# Patient Record
Sex: Female | Born: 1980 | Race: White | Hispanic: No | Marital: Married | Smoking: Never smoker
Health system: Southern US, Community
[De-identification: ages and names within clinical notes are randomized; demographics above are authoritative.]

## PROBLEM LIST (undated history)

## (undated) DIAGNOSIS — R87619 Unspecified abnormal cytological findings in specimens from cervix uteri: Secondary | ICD-10-CM

## (undated) DIAGNOSIS — J45909 Unspecified asthma, uncomplicated: Secondary | ICD-10-CM

## (undated) DIAGNOSIS — R51 Headache: Secondary | ICD-10-CM

## (undated) DIAGNOSIS — L309 Dermatitis, unspecified: Secondary | ICD-10-CM

## (undated) DIAGNOSIS — IMO0002 Reserved for concepts with insufficient information to code with codable children: Secondary | ICD-10-CM

## (undated) DIAGNOSIS — O4290 Premature rupture of membranes, unspecified as to length of time between rupture and onset of labor, unspecified weeks of gestation: Secondary | ICD-10-CM

## (undated) HISTORY — DX: Unspecified asthma, uncomplicated: J45.909

## (undated) HISTORY — PX: ENDOMETRIAL BIOPSY: SHX622

## (undated) HISTORY — PX: WISDOM TOOTH EXTRACTION: SHX21

## (undated) HISTORY — DX: Dermatitis, unspecified: L30.9

## (undated) HISTORY — PX: COLPOSCOPY: SHX161

---

## 2001-07-29 ENCOUNTER — Encounter: Payer: Self-pay | Admitting: Internal Medicine

## 2001-07-29 ENCOUNTER — Ambulatory Visit (HOSPITAL_COMMUNITY): Admission: RE | Admit: 2001-07-29 | Discharge: 2001-07-29 | Payer: Self-pay | Admitting: Internal Medicine

## 2004-03-05 ENCOUNTER — Ambulatory Visit (HOSPITAL_COMMUNITY): Admission: RE | Admit: 2004-03-05 | Discharge: 2004-03-05 | Payer: Self-pay | Admitting: Family Medicine

## 2011-06-06 ENCOUNTER — Ambulatory Visit (INDEPENDENT_AMBULATORY_CARE_PROVIDER_SITE_OTHER): Payer: 59 | Admitting: Family Medicine

## 2011-06-06 DIAGNOSIS — K645 Perianal venous thrombosis: Secondary | ICD-10-CM

## 2011-06-06 MED ORDER — HYDROCORTISONE 2.5 % RE CREA: TOPICAL_CREAM | Freq: Two times a day (BID) | RECTAL | Status: AC

## 2011-06-06 MED ORDER — HYDROCORTISONE ACETATE 25 MG RE SUPP: 25.0000 mg | Freq: Two times a day (BID) | RECTAL | Status: AC

## 2011-06-06 NOTE — Progress Notes (Signed)
  [facility]:  Office Visit  Chief Complaint:  Chief Complaint  Patient presents with  . Hemorrhoids    over a week    HPI: Roberta Wood is a 31 y.o. female who complains of hemorrhoids x 10 days. Tried Ibuprofena nd Prep H without relief. No prior h/o hemorrhoids. Denies constipation. No children. Does not have to strain during defecation. Denies bloody stools.   History reviewed. No pertinent past medical history. History reviewed. No pertinent past surgical history. History   Social History  . Marital Status: Married    Spouse Name: N/A    Number of Children: N/A  . Years of Education: N/A   Social History Main Topics  . Smoking status: Never Smoker   . Smokeless tobacco: None  . Alcohol Use: Yes  . Drug Use: No  . Sexually Active: None   Other Topics Concern  . None   Social History Narrative  . None   Family History  Problem Relation Age of Onset  . Heart disease Father    Allergies  Allergen Reactions  . Penicillins    Prior to Admission medications   Not on File     ROS: The patient denies fevers, chills, night sweats, unintentional weight loss, chest pain, palpitations, wheezing, dyspnea on exertion, nausea, vomiting, abdominal pain, dysuria, hematuria, melena, numbness, weakness, or tingling.   All other systems have been reviewed and were otherwise negative with the exception of those mentioned in the HPI and as above.    PHYSICAL EXAM: Filed Vitals:   06/06/11 1214  BP: 119/71  Pulse: 87  Temp: 99.3 F (37.4 C)  Resp: 18   Filed Vitals:   06/06/11 1214  Height: 5\' 5"  (1.651 m)  Weight: 124 lb (56.246 kg)   Body mass index is 20.63 kg/(m^2).  General: Alert, no acute distress HEENT:  Normocephalic, atraumatic, oropharynx patent.  Cardiovascular:  Regular rate and rhythm, no rubs murmurs or gallops.  No Carotid bruits, radial pulse intact. No pedal edema.  Respiratory: Clear to auscultation bilaterally.  No  wheezes, rales, or rhonchi.  No cyanosis, no use of accessory musculature GI: No organomegaly, abdomen is soft and non-tender, positive bowel sounds.  No masses. Skin: No rashes. Neurologic: Facial musculature symmetric. Psychiatric: Patient is appropriate throughout our interaction. Lymphatic: No cervical lymphadenopathy Musculoskeletal: Gait intact. + external hemmorhoids, not thrombotic.    LABS: No results found for this or any previous visit.   EKG/XRAY:   Primary read interpreted by Dr. Conley Rolls at [facility].   ASSESSMENT/PLAN: Encounter Diagnosis  Name Primary?  . Hemorrhoids Yes    Sxs treatment with Anusol suppository and HCL cream prn.  Instructions on management of external hemorrhoids given.  Currently not embolic/thrombotic so no evacuation needed. F/u prn if worsening sxs   Linas Stepter PHUONG, DO 06/09/2011 2:12 PM

## 2012-07-20 ENCOUNTER — Other Ambulatory Visit: Payer: Self-pay

## 2012-09-24 ENCOUNTER — Other Ambulatory Visit (HOSPITAL_COMMUNITY): Payer: Self-pay | Admitting: Obstetrics and Gynecology

## 2012-09-24 DIAGNOSIS — Q270 Congenital absence and hypoplasia of umbilical artery: Secondary | ICD-10-CM

## 2012-09-28 ENCOUNTER — Ambulatory Visit (HOSPITAL_COMMUNITY)
Admission: RE | Admit: 2012-09-28 | Discharge: 2012-09-28 | Disposition: A | Discharge status: Home / Self Care | Payer: 59 | Source: Ambulatory Visit | Attending: Obstetrics and Gynecology | Admitting: Obstetrics and Gynecology

## 2012-09-28 ENCOUNTER — Encounter (HOSPITAL_COMMUNITY): Payer: Self-pay

## 2012-09-28 DIAGNOSIS — Z363 Encounter for antenatal screening for malformations: Secondary | ICD-10-CM | POA: Insufficient documentation

## 2012-09-28 DIAGNOSIS — O352XX0 Maternal care for (suspected) hereditary disease in fetus, not applicable or unspecified: Secondary | ICD-10-CM | POA: Insufficient documentation

## 2012-09-28 DIAGNOSIS — Q270 Congenital absence and hypoplasia of umbilical artery: Secondary | ICD-10-CM

## 2012-09-28 DIAGNOSIS — O358XX0 Maternal care for other (suspected) fetal abnormality and damage, not applicable or unspecified: Secondary | ICD-10-CM | POA: Insufficient documentation

## 2012-09-28 DIAGNOSIS — Z1389 Encounter for screening for other disorder: Secondary | ICD-10-CM | POA: Insufficient documentation

## 2012-11-13 ENCOUNTER — Encounter (HOSPITAL_COMMUNITY): Payer: Self-pay | Admitting: *Deleted

## 2012-11-13 ENCOUNTER — Inpatient Hospital Stay (HOSPITAL_COMMUNITY)
Admission: AD | Admit: 2012-11-13 | Discharge: 2012-12-29 | DRG: 765 | Disposition: A | Discharge status: Home / Self Care | Payer: 59 | Source: Ambulatory Visit | Attending: Obstetrics and Gynecology | Admitting: Obstetrics and Gynecology

## 2012-11-13 ENCOUNTER — Inpatient Hospital Stay (HOSPITAL_COMMUNITY): Payer: 59

## 2012-11-13 DIAGNOSIS — O34219 Maternal care for unspecified type scar from previous cesarean delivery: Secondary | ICD-10-CM | POA: Diagnosis present

## 2012-11-13 DIAGNOSIS — O99892 Other specified diseases and conditions complicating childbirth: Secondary | ICD-10-CM | POA: Diagnosis present

## 2012-11-13 DIAGNOSIS — O411090 Infection of amniotic sac and membranes, unspecified, unspecified trimester, not applicable or unspecified: Secondary | ICD-10-CM | POA: Diagnosis present

## 2012-11-13 DIAGNOSIS — Z98891 History of uterine scar from previous surgery: Secondary | ICD-10-CM

## 2012-11-13 DIAGNOSIS — O4290 Premature rupture of membranes, unspecified as to length of time between rupture and onset of labor, unspecified weeks of gestation: Principal | ICD-10-CM | POA: Diagnosis present

## 2012-11-13 DIAGNOSIS — O321XX0 Maternal care for breech presentation, not applicable or unspecified: Secondary | ICD-10-CM | POA: Diagnosis present

## 2012-11-13 DIAGNOSIS — O322XX0 Maternal care for transverse and oblique lie, not applicable or unspecified: Secondary | ICD-10-CM | POA: Diagnosis present

## 2012-11-13 DIAGNOSIS — Q270 Congenital absence and hypoplasia of umbilical artery: Secondary | ICD-10-CM

## 2012-11-13 HISTORY — DX: Premature rupture of membranes, unspecified as to length of time between rupture and onset of labor, unspecified weeks of gestation: O42.90

## 2012-11-13 HISTORY — DX: Headache: R51

## 2012-11-13 HISTORY — DX: Unspecified abnormal cytological findings in specimens from cervix uteri: R87.619

## 2012-11-13 HISTORY — DX: Reserved for concepts with insufficient information to code with codable children: IMO0002

## 2012-11-13 LAB — CBC
Platelets: 222 10*3/uL (ref 150–400)
RBC: 3.99 MIL/uL (ref 3.87–5.11)
RDW: 12.9 % (ref 11.5–15.5)
WBC: 10.4 10*3/uL (ref 4.0–10.5)

## 2012-11-13 LAB — COMPREHENSIVE METABOLIC PANEL
ALT: 13 U/L (ref 0–35)
AST: 19 U/L (ref 0–37)
Albumin: 3.2 g/dL — ABNORMAL LOW (ref 3.5–5.2)
Alkaline Phosphatase: 78 U/L (ref 39–117)
CO2: 22 mEq/L (ref 19–32)
Chloride: 101 mEq/L (ref 96–112)
GFR calc non Af Amer: >90 mL/min (ref >90)
Potassium: 3.6 mEq/L (ref 3.5–5.1)
Sodium: 137 mEq/L (ref 135–145)
Total Bilirubin: 0.3 mg/dL (ref 0.3–1.2)

## 2012-11-13 LAB — POCT FERN TEST: POCT Fern Test: POSITIVE

## 2012-11-13 MED ORDER — MAGNESIUM SULFATE BOLUS VIA INFUSION
4.0000 g | Freq: Once | INTRAVENOUS | Status: AC
  Administered 2012-11-13: 4 g via INTRAVENOUS
  Filled 2012-11-13: qty 500

## 2012-11-13 MED ORDER — LACTATED RINGERS IV SOLN
INTRAVENOUS | Status: DC
  Administered 2012-11-13 – 2012-11-16 (×6): via INTRAVENOUS
  Administered 2012-11-29: 100 mL via INTRAVENOUS

## 2012-11-13 MED ORDER — BETAMETHASONE SOD PHOS & ACET 6 (3-3) MG/ML IJ SUSP
12.0000 mg | Freq: Once | INTRAMUSCULAR | Status: AC
  Administered 2012-11-13: 12 mg via INTRAMUSCULAR
  Filled 2012-11-13: qty 2

## 2012-11-13 MED ORDER — ACETAMINOPHEN 325 MG PO TABS
650.0000 mg | ORAL_TABLET | ORAL | Status: DC | PRN
  Administered 2012-11-14 – 2012-12-24 (×16): 650 mg via ORAL
  Filled 2012-11-13 (×16): qty 2

## 2012-11-13 MED ORDER — CALCIUM CARBONATE ANTACID 500 MG PO CHEW
2.0000 | CHEWABLE_TABLET | ORAL | Status: DC | PRN
  Administered 2012-12-02 – 2012-12-19 (×3): 400 mg via ORAL
  Filled 2012-11-13 (×3): qty 2

## 2012-11-13 MED ORDER — ACYCLOVIR 5 % EX OINT
TOPICAL_OINTMENT | CUTANEOUS | Status: DC
  Administered 2012-11-13: 22:00:00 via TOPICAL
  Filled 2012-11-13: qty 30

## 2012-11-13 MED ORDER — PRENATAL MULTIVITAMIN CH
1.0000 | ORAL_TABLET | Freq: Every day | ORAL | Status: DC
  Administered 2012-11-14 – 2012-12-25 (×41): 1 via ORAL
  Filled 2012-11-13 (×41): qty 1

## 2012-11-13 MED ORDER — DEXTROSE 5 % IV SOLN
500.0000 mg | INTRAVENOUS | Status: AC
  Administered 2012-11-13 – 2012-11-14 (×2): 500 mg via INTRAVENOUS
  Filled 2012-11-13 (×2): qty 500

## 2012-11-13 MED ORDER — AZITHROMYCIN 500 MG PO TABS
500.0000 mg | ORAL_TABLET | Freq: Every day | ORAL | Status: AC
  Administered 2012-11-15 – 2012-11-19 (×5): 500 mg via ORAL
  Filled 2012-11-13 (×5): qty 1

## 2012-11-13 MED ORDER — CLINDAMYCIN PHOSPHATE 900 MG/50ML IV SOLN
900.0000 mg | Freq: Three times a day (TID) | INTRAVENOUS | Status: DC
  Administered 2012-11-13 – 2012-11-16 (×9): 900 mg via INTRAVENOUS
  Filled 2012-11-13 (×10): qty 50

## 2012-11-13 MED ORDER — ZOLPIDEM TARTRATE 5 MG PO TABS
5.0000 mg | ORAL_TABLET | Freq: Every evening | ORAL | Status: DC | PRN
  Administered 2012-11-14 – 2012-12-18 (×17): 5 mg via ORAL
  Filled 2012-11-13 (×19): qty 1

## 2012-11-13 MED ORDER — MAGNESIUM SULFATE 40 G IN LACTATED RINGERS - SIMPLE
2.0000 g/h | INTRAVENOUS | Status: DC
  Administered 2012-11-13 – 2012-11-14 (×2): 2 g/h via INTRAVENOUS
  Filled 2012-11-13 (×2): qty 500

## 2012-11-13 MED ORDER — DOCUSATE SODIUM 100 MG PO CAPS
100.0000 mg | ORAL_CAPSULE | Freq: Every day | ORAL | Status: DC
  Administered 2012-11-14 – 2012-12-25 (×39): 100 mg via ORAL
  Filled 2012-11-13 (×40): qty 1

## 2012-11-13 MED ORDER — PENCICLOVIR 1 % EX CREA
1.0000 "application " | TOPICAL_CREAM | CUTANEOUS | Status: DC
  Filled 2012-11-13: qty 1.5

## 2012-11-13 NOTE — H&P (Signed)
Roberta Wood is a 32 y.o. female presenting for C/O leaking AF. Patient states spontaneous leaking at home that has continued. No fever, no N/V, no vaginal bleeding, no UCs. Pregnancy complicated by single umbilical artery with an otherwise normal anatomic survey with MFM. Maternal Medical History:  Reason for admission: Rupture of membranes.   Fetal activity: Perceived fetal activity is normal.      OB History   Grav Para Term Preterm Abortions TAB SAB Ect Mult Living   1         0     Past Medical History  Diagnosis Date  . Abnormal Pap smear   . WUJWJXBJ(478.2)    Past Surgical History  Procedure Laterality Date  . Colposcopy    . Wisdom tooth extraction    . Endometrial biopsy     Family History: family history includes Heart disease in her father. Social History:  reports that she has never smoked. She does not have any smokeless tobacco history on file. She reports that  drinks alcohol. She reports that she does not use illicit drugs.   Prenatal Transfer Tool  Maternal Diabetes: No Genetic Screening: Declined Maternal Ultrasounds/Referrals: Abnormal:  Findings:   Other:single umbilical artery Fetal Ultrasounds or other Referrals:  None Maternal Substance Abuse:  No Significant Maternal Medications:  None Significant Maternal Lab Results:  None Other Comments:  None  Review of Systems  Constitutional: Negative for fever.  Eyes: Negative for blurred vision.  Gastrointestinal: Negative for vomiting.  Neurological: Negative for headaches.    Dilation:  (exam deferred) Blood pressure 106/70, pulse 92, temperature 97.8 F (36.6 C), temperature source Oral, resp. rate 18, height 5\' 5"  (1.651 m), weight 128 lb 6 oz (58.231 kg), last menstrual period 05/11/2012. Maternal Exam:  Uterine Assessment: Contraction strength is mild.  Contraction frequency is irregular.   Abdomen: Fetal presentation: breech     Physical Exam  Cardiovascular: Normal rate and  regular rhythm.   Respiratory: Effort normal and breath sounds normal.  GI: Soft. There is no tenderness.  Neurological: She has normal reflexes.    Gross rupture of clear fluid per physician extender exam in MAU  U/S-Frank Breech, AFI 14.6, EFW 1012 gm (2# 4oz), placenta anterior above cervix, cx 3.3cm-no funneling  Results for orders placed during the hospital encounter of 11/13/12 (from the past 24 hour(s))  POCT FERN TEST     Status: None   Collection Time    11/13/12  4:36 PM      Result Value Range   POCT Fern Test Positive = ruptured amniotic membanes    CBC     Status: None   Collection Time    11/13/12  5:01 PM      Result Value Range   WBC 10.4  4.0 - 10.5 K/uL   RBC 3.99  3.87 - 5.11 MIL/uL   Hemoglobin 12.6  12.0 - 15.0 g/dL   HCT 95.6  21.3 - 08.6 %   MCV 92.2  78.0 - 100.0 fL   MCH 31.6  26.0 - 34.0 pg   MCHC 34.2  30.0 - 36.0 g/dL   RDW 57.8  46.9 - 62.9 %   Platelets 222  150 - 400 K/uL  COMPREHENSIVE METABOLIC PANEL     Status: Abnormal   Collection Time    11/13/12  5:01 PM      Result Value Range   Sodium 137  135 - 145 mEq/L   Potassium 3.6  3.5 - 5.1 mEq/L  Chloride 101  96 - 112 mEq/L   CO2 22  19 - 32 mEq/L   Glucose, Bld 95  70 - 99 mg/dL   BUN 8  6 - 23 mg/dL   Creatinine, Ser 6.23  0.50 - 1.10 mg/dL   Calcium 9.5  8.4 - 76.2 mg/dL   Total Protein 6.6  6.0 - 8.3 g/dL   Albumin 3.2 (*) 3.5 - 5.2 g/dL   AST 19  0 - 37 U/L   ALT 13  0 - 35 U/L   Alkaline Phosphatase 78  39 - 117 U/L   Total Bilirubin 0.3  0.3 - 1.2 mg/dL   GFR calc non Af Amer >90  >90 mL/min   GFR calc Af Amer >90  >90 mL/min    Prenatal labs: ABO, Rh:   Antibody:   Rubella:   RPR:    HBsAg:    HIV:    GBS:     Assessment/Plan: 32 yo G1P0 at 5 4/7 weeks by early U/S with PPROM, breech D/W patient and husband above, possibility of premature delivery for onset of labor, infection, non-reassuring fetal status, etc.. D/W C/S for delivery with breech baby, possible  vertical uterine incision and future repeat C/S Magnesium Sulfate for neuroprophylaxis, antibiotics for GBBS prophylaxis, betamethasone Neonatology consult ordered   Roselyne Stalnaker II,Takeshi Teasdale E 11/13/2012, 6:19 PM

## 2012-11-13 NOTE — MAU Note (Signed)
Pt states began leaking this am, thought it was urine. Noted clear lof coming from vagina not urethra when she went to restroom last at home.

## 2012-11-13 NOTE — MAU Provider Note (Signed)
  History     CSN: [id number]  Arrival date and time: 11/13/12 1533   First Provider Initiated Contact with Patient 11/13/12 1611      Chief Complaint  Patient presents with  . R/O Rupture, contractions    HPI Roberta Wood 32 y.o. [redacted]w[redacted]d   Comes to MAU with history of leaking of clear fluid several times today.  Has had some irregular contractions but is not uncomfortable.  OB History   Grav Para Term Preterm Abortions TAB SAB Ect Mult Living   1         0      Past Medical History  Diagnosis Date  . Abnormal Pap smear   . WUJWJXBJ(478.2)     Past Surgical History  Procedure Laterality Date  . Colposcopy    . Wisdom tooth extraction    . Endometrial biopsy      Family History  Problem Relation Age of Onset  . Heart disease Father     History  Substance Use Topics  . Smoking status: Never Smoker   . Smokeless tobacco: Not on file  . Alcohol Use: Yes    Allergies:  Allergies  Allergen Reactions  . Penicillins Hives    Prescriptions prior to admission  Medication Sig Dispense Refill  . calcium carbonate (TUMS - DOSED IN MG ELEMENTAL CALCIUM) 500 MG chewable tablet Chew 1 tablet by mouth daily as needed for heartburn.      . Loratadine (CLARITIN PO) Take 1 each by mouth daily as needed (allergies).      . penciclovir (DENAVIR) 1 % cream Apply 1 application topically every 2 (two) hours as needed (cold soar).      . Prenatal Vit-Fe Fumarate-FA (PRENATAL MULTIVITAMIN) TABS Take 1 tablet by mouth daily at 12 noon.        Review of Systems  Constitutional: Negative for fever.  Gastrointestinal: Negative for nausea, vomiting and abdominal pain.  Genitourinary:       Leaking fluid. No vaginal bleeding   Physical Exam   Blood pressure 106/70, pulse 92, temperature 97.8 F (36.6 C), temperature source Oral, resp. rate 18, height 5\' 5"  (1.651 m), weight 128 lb 6 oz (58.231 kg), last menstrual period 05/11/2012.  Physical Exam  Nursing note and vitals  reviewed. Constitutional: She is oriented to person, place, and time. She appears well-developed and well-nourished.  HENT:  Head: Normocephalic.  Eyes: EOM are normal.  Neck: Neck supple.  GI: Soft. There is no tenderness. There is no rebound and no guarding.  Genitourinary:  Speculum exam As speculum was inserted and started to open, clear amniotic fluid filled the speculum and ran out into the floor.  Speculum withdrawn.  Cervix not visualized.  No bimanual exam done.  Fern slide made from sterile swab.  Musculoskeletal: Normal range of motion.  Neurological: She is alert and oriented to person, place, and time.  Skin: Skin is warm and dry.  Psychiatric: She has a normal mood and affect.    MAU Course  Procedures  MDM 1645  Dr. Henderson Cloud notified.  Gross ROM Client was teary after speculum exam and contractions every 2-4 minutes noted while IVF started.  Assessment and Plan  PPROM  Plan Admit See orders.  Roberta Wood 11/13/2012, 4:46 PM

## 2012-11-13 NOTE — MAU Note (Signed)
Pam, RN charge Antenatal given pt report. Pt to go to room 152.

## 2012-11-14 LAB — CBC
MCV: 92.2 fL (ref 78.0–100.0)
Platelets: 211 10*3/uL (ref 150–400)
RBC: 3.61 MIL/uL — ABNORMAL LOW (ref 3.87–5.11)
WBC: 13.1 10*3/uL — ABNORMAL HIGH (ref 4.0–10.5)

## 2012-11-14 LAB — MAGNESIUM: Magnesium: 4.5 mg/dL — ABNORMAL HIGH (ref 1.5–2.5)

## 2012-11-14 MED ORDER — ACYCLOVIR 5 % EX OINT
TOPICAL_OINTMENT | CUTANEOUS | Status: DC
  Administered 2012-11-14 – 2012-11-18 (×27): via TOPICAL
  Administered 2012-11-18: 1 via TOPICAL
  Administered 2012-11-18 – 2012-11-19 (×6): via TOPICAL
  Filled 2012-11-14 (×2): qty 5
  Filled 2012-11-14: qty 30

## 2012-11-14 MED ORDER — ONDANSETRON HCL 4 MG/2ML IJ SOLN
4.0000 mg | Freq: Four times a day (QID) | INTRAMUSCULAR | Status: DC | PRN
  Administered 2012-11-14 – 2012-12-25 (×4): 4 mg via INTRAVENOUS
  Filled 2012-11-14 (×4): qty 2

## 2012-11-14 MED ORDER — BETAMETHASONE SOD PHOS & ACET 6 (3-3) MG/ML IJ SUSP
12.0000 mg | Freq: Once | INTRAMUSCULAR | Status: AC
  Administered 2012-11-14: 12 mg via INTRAMUSCULAR
  Filled 2012-11-14: qty 2

## 2012-11-14 NOTE — Progress Notes (Signed)
Notified Dr. Henderson Cloud of prolonged decels noted on strip graph.  Instructed to continue mag. Sulfate, bedrest, and BSC.

## 2012-11-14 NOTE — Progress Notes (Signed)
Pt states she has been unable to sleep and has just been "dozing off" and requesting something for sleep at this time; ambien 5mg  given per MD order.

## 2012-11-14 NOTE — Consult Note (Signed)
Neonatology Consult Note: At the request of the patients obstetrician Dr. Henderson Cloud I met with Ms. Firmin (and husband) who is at 26 4 wks currently with pregnancy complicated by PPROM, breech presentation, single umbilical artery.  Getting BMZ and magnesium sulfate.    We discussed morbidity/mortality at this gestional age, delivery room resuscitation, including intubation and surfactant in DR.  Discussed mechanical ventilation and risk for chronic lung disease, risk for IVH with potential for motor / cognitive deficits, ROP, NEC, sepsis, as well as temperature instability and feeding immaturity.  Discussed NG / OG feeds, benefits of MBM in reducing incidence of NEC.   Discussed likely length of stay. Thank you for allowing [place] to participate in her care.  Please call with questions.  John Giovanni, DO  Neonatologist  The total length of face-to-face or floor / unit time for this encounter was 25 minutes.  Counseling and / or coordination of care was greater than fifty percent of the time.         The total length of face-to-face or floor / unit time for this encounter was 30 minutes.  Counseling and / or coordination of care was greater than fifty percent of the time.

## 2012-11-14 NOTE — Progress Notes (Signed)
26 5/7 wks  Continues to leak small amounts of clear fluid Not feeling contractions No bleeding  VSS Afeb Ut soft/NT Ext PAS on Lungs CTA DTR 2+  FHT reactive UCs mild irreg  BMTZ #2 today  Magnesium Sulfate running  Results for orders placed during the hospital encounter of 11/13/12 (from the past 24 hour(s))  POCT FERN TEST     Status: None   Collection Time    11/13/12  4:36 PM      Result Value Range   POCT Fern Test Positive = ruptured amniotic membanes    CBC     Status: None   Collection Time    11/13/12  5:01 PM      Result Value Range   WBC 10.4  4.0 - 10.5 K/uL   RBC 3.99  3.87 - 5.11 MIL/uL   Hemoglobin 12.6  12.0 - 15.0 g/dL   HCT 21.3  08.6 - 57.8 %   MCV 92.2  78.0 - 100.0 fL   MCH 31.6  26.0 - 34.0 pg   MCHC 34.2  30.0 - 36.0 g/dL   RDW 46.9  62.9 - 52.8 %   Platelets 222  150 - 400 K/uL  COMPREHENSIVE METABOLIC PANEL     Status: Abnormal   Collection Time    11/13/12  5:01 PM      Result Value Range   Sodium 137  135 - 145 mEq/L   Potassium 3.6  3.5 - 5.1 mEq/L   Chloride 101  96 - 112 mEq/L   CO2 22  19 - 32 mEq/L   Glucose, Bld 95  70 - 99 mg/dL   BUN 8  6 - 23 mg/dL   Creatinine, Ser 4.13  0.50 - 1.10 mg/dL   Calcium 9.5  8.4 - 24.4 mg/dL   Total Protein 6.6  6.0 - 8.3 g/dL   Albumin 3.2 (*) 3.5 - 5.2 g/dL   AST 19  0 - 37 U/L   ALT 13  0 - 35 U/L   Alkaline Phosphatase 78  39 - 117 U/L   Total Bilirubin 0.3  0.3 - 1.2 mg/dL   GFR calc non Af Amer >90  >90 mL/min   GFR calc Af Amer >90  >90 mL/min  CBC     Status: Abnormal   Collection Time    11/14/12  7:00 AM      Result Value Range   WBC 13.1 (*) 4.0 - 10.5 K/uL   RBC 3.61 (*) 3.87 - 5.11 MIL/uL   Hemoglobin 11.5 (*) 12.0 - 15.0 g/dL   HCT 01.0 (*) 27.2 - 53.6 %   MCV 92.2  78.0 - 100.0 fL   MCH 31.9  26.0 - 34.0 pg   MCHC 34.5  30.0 - 36.0 g/dL   RDW 64.4  03.4 - 74.2 %   Platelets 211  150 - 400 K/uL    A: PPROM     BREECH  P: Continue magnesium sulfate for  neuroprophylaxis until am     BMTZ     CBC tomorrow     U/S tomorrow to check AFI and cx

## 2012-11-15 LAB — CBC
Hemoglobin: 10.3 g/dL — ABNORMAL LOW (ref 12.0–15.0)
MCHC: 34 g/dL (ref 30.0–36.0)
RBC: 3.25 MIL/uL — ABNORMAL LOW (ref 3.87–5.11)
WBC: 15.2 10*3/uL — ABNORMAL HIGH (ref 4.0–10.5)

## 2012-11-15 MED ORDER — OXYCODONE-ACETAMINOPHEN 5-325 MG PO TABS
1.0000 | ORAL_TABLET | Freq: Four times a day (QID) | ORAL | Status: DC | PRN
  Administered 2012-11-15 – 2012-12-24 (×3): 1 via ORAL
  Filled 2012-11-15 (×3): qty 1

## 2012-11-15 MED ORDER — POLYETHYLENE GLYCOL 3350 17 G PO PACK
17.0000 g | PACK | Freq: Every day | ORAL | Status: DC | PRN
  Administered 2012-11-15 – 2012-12-09 (×3): 17 g via ORAL
  Filled 2012-11-15 (×2): qty 1

## 2012-11-15 NOTE — Progress Notes (Signed)
Pt c/o HA today.  Leaking clear fluid.  Occ ctx.  No vb.  + FM  FHT + Toco occasional Cvx deferred Abd gravid, mildly tender RLQ  A/P:  PPROM D/c mag today S/p BMZ [place] today

## 2012-11-15 NOTE — Progress Notes (Signed)
Per MD will see if HA goes away after Magnesium is d/c'd if not prn ordered

## 2012-11-16 ENCOUNTER — Inpatient Hospital Stay (HOSPITAL_COMMUNITY): Payer: 59

## 2012-11-16 LAB — CULTURE, BETA STREP (GROUP B ONLY)

## 2012-11-16 LAB — CBC
HCT: 30.2 % — ABNORMAL LOW (ref 36.0–46.0)
Hemoglobin: 10.3 g/dL — ABNORMAL LOW (ref 12.0–15.0)
RDW: 13.4 % (ref 11.5–15.5)
WBC: 9.8 10*3/uL (ref 4.0–10.5)

## 2012-11-16 MED ORDER — PSEUDOEPHEDRINE HCL 30 MG PO TABS
60.0000 mg | ORAL_TABLET | Freq: Four times a day (QID) | ORAL | Status: DC | PRN
  Administered 2012-11-16 – 2012-12-17 (×2): 60 mg via ORAL
  Filled 2012-11-16 (×2): qty 2

## 2012-11-16 MED ORDER — OXYMETAZOLINE HCL 0.05 % NA SOLN
1.0000 | Freq: Two times a day (BID) | NASAL | Status: DC
  Administered 2012-11-16 – 2012-12-25 (×75): 1 via NASAL
  Filled 2012-11-16 (×2): qty 15

## 2012-11-16 MED ORDER — CLINDAMYCIN HCL 300 MG PO CAPS
300.0000 mg | ORAL_CAPSULE | Freq: Three times a day (TID) | ORAL | Status: AC
  Administered 2012-11-16 – 2012-11-21 (×15): 300 mg via ORAL
  Filled 2012-11-16 (×15): qty 1

## 2012-11-16 NOTE — Progress Notes (Signed)
11/16/12 1400  Clinical Encounter Type  Visited With Patient and family together (husband Arlys John)  Visit Type Initial;Spiritual support;Social support  Spiritual Encounters  Spiritual Needs Emotional   Introduced spiritual care and chaplain availability.  Learned some of couple's story, including how they met at [employer], my alma mater. Built rapport for future support.  They expect that their priest from [employer] will visit this afternoon or sometime tomorrow and report good support from local family and friends.  Provided pastoral listening and encouragement.  Will follow for support, but please also page as needed.  [address] [place], [place] [phone]

## 2012-11-16 NOTE — Progress Notes (Signed)
Patient ID: Roberta Wood, female   DOB: May 07, 1980, 32 y.o.   MRN: [id number] Pt without complaints GFM Occas Ctxs Leaking clear fluid  VSSAF FHR 140s with accels Occas prolonged decels responding to IVF and position change  Abd Gravid nt  PPROM at 27 0/7 MFM [place] today Cont Latency Abx S/P Mag Breech - C/S For delivery DL

## 2012-11-16 NOTE — Progress Notes (Signed)
Bedside US performed by Dr. Rana Snare and confirmed the baby was frank breech, which is different than what the MFM report from the [place] stated.

## 2012-11-17 ENCOUNTER — Inpatient Hospital Stay (HOSPITAL_COMMUNITY): Payer: 59

## 2012-11-17 MED ORDER — NYSTATIN-TRIAMCINOLONE 100000-0.1 UNIT/GM-% EX CREA
TOPICAL_CREAM | Freq: Three times a day (TID) | CUTANEOUS | Status: DC
  Administered 2012-11-18 – 2012-11-26 (×21): via TOPICAL
  Filled 2012-11-17: qty 15

## 2012-11-17 NOTE — Progress Notes (Addendum)
Roberta Wood  was seen today for an ultrasound appointment.  See full report in [facility].  Impression: Single IUP at 27 1/7 weeks PROM, single umbilical artery Cephalic presentation on ultrasound today Active fetus with BPP of 8/8 Normal amniotic fluid volume  Recommendations: Recommend follow-up ultrasound examination in 4 weeks for growth. BPPs should be repeated as needed based on electronic fetal monitoring.  Alpha Gula, MD

## 2012-11-17 NOTE — Progress Notes (Signed)
Notified Dr. Vincente Poli of prolong decels noted on pt's strip graph.  Decels are recurrent and pt. Has been repositioned to right and left sides x2.  Decels continue with minimal change.  Dr. Vincente Poli to look at strip.  No orders given at this time.

## 2012-11-17 NOTE — Progress Notes (Signed)
Pt. Returned to her room via w/c.  Assisted into bed.

## 2012-11-17 NOTE — Progress Notes (Signed)
PT. Taken to MFM via w/c accompanied by NA Eunice Blase) for Bpp.  FMs removed.

## 2012-11-17 NOTE — Progress Notes (Signed)
Pt. For BPP today per order Dr. Vincente Poli.  U/S has been notified and pt. Informed.

## 2012-11-17 NOTE — Progress Notes (Signed)
S: Patient is doing well. Denies abdominal pain. Reports good fetal movement.  O: afebrile Vital signs stable Abdomen is soft and non tender FHR is Category 1  IMPRESSION: PPROM at 27 weeks and 1 day No evidence of chorioamnionitis Continue Azithromax and Clindamycin Breech yesterday - C section for delivery if still breech  No evidence of labor today  Plan of care reviewed with the patient

## 2012-11-18 MED ORDER — SODIUM CHLORIDE 0.9 % IJ SOLN
3.0000 mL | Freq: Two times a day (BID) | INTRAMUSCULAR | Status: DC
  Administered 2012-11-18 – 2012-12-21 (×62): 3 mL via INTRAVENOUS

## 2012-11-18 NOTE — Progress Notes (Signed)
27 2/7 weeks  Still leaking small amounts, no bleeding Had a few UCs last night, none now  VSS Afeb Lungs CTA Cor RRR Abd Ut soft, NT Ext NT  FHT + accels, occasional variable decel-not repetitive  U/S yesterday   Vtx, AFI adequate, unable to access full report on EPIC right now, PACS will not open  A: PPROM at 27 2/7 weeks     Vtx  P: Continue present care

## 2012-11-19 MED ORDER — ACYCLOVIR 5 % EX OINT
TOPICAL_OINTMENT | Freq: Every day | CUTANEOUS | Status: DC
  Administered 2012-11-20 – 2012-11-25 (×19): via TOPICAL

## 2012-11-19 NOTE — Progress Notes (Signed)
Pt sitting up in the bed eating breakfast  

## 2012-11-19 NOTE — Progress Notes (Signed)
Patient ID: Roberta Wood, female   DOB: 06-29-1980, 32 y.o.   MRN: [id number]   [redacted]w[redacted]d  S//  Resting, no new c/o  O// BP 97/64  Pulse 82  Temp(Src) 98.4 F (36.9 C) (Oral)  Resp 18  Ht 5\' 5"  (1.651 m)  Wt 128 lb 6 oz (58.231 kg)  BMI 21.36 kg/m2  SpO2 98%  LMP 05/11/2012  No results found for this or any previous visit (from the past 24 hour(s)). FHR stable  A+P//  Now vtx by [place] w/ BPP 8/8 yest, finish latency ABX

## 2012-11-20 NOTE — Progress Notes (Signed)
Patient ID: Roberta Wood, female   DOB: Feb 26, 1981, 32 y.o.   MRN: [id number]   100w4d  S// rested well last PM, min leaking  O//  BP 86/54  Pulse 83  Temp(Src) 98.7 F (37.1 C) (Oral)  Resp 18  Ht 5\' 5"  (1.651 m)  Wt 128 lb 6 oz (58.231 kg)  BMI 21.36 kg/m2  SpO2 98%  LMP 05/11/2012  Stable FHR,   A+P//[redacted]w[redacted]d, PPROM, vtx by last [place], completed BMZ + latency ABX

## 2012-11-21 NOTE — Progress Notes (Signed)
Pt. C/o of cramping and contractions but unable to trace on monitor. MD notified and he said to call him back if pain and contractions increase. No new orders.

## 2012-11-21 NOTE — Progress Notes (Signed)
Patient ID: Roberta Wood, female   DOB: 1980/08/19, 32 y.o.   MRN: [id number] 101w5d  S//min leaking last PM O// BP 92/63  Pulse 79  Temp(Src) 98.4 F (36.9 C) (Oral)  Resp 18  Ht 5\' 5"  (1.651 m)  Wt 128 lb 6 oz (58.231 kg)  BMI 21.36 kg/m2  SpO2 98%  LMP 05/11/2012  Stable FHR  A+P//  PPROM, repeat US this week

## 2012-11-22 NOTE — Progress Notes (Signed)
Ur chart review completed.  

## 2012-11-22 NOTE — Progress Notes (Signed)
Pt off after reassurring FHR

## 2012-11-22 NOTE — Progress Notes (Signed)
Patient ID: Roberta Wood, female   DOB: 01/04/1981, 32 y.o.   MRN: [id number] Pt without complaints GFM Occas clear leaking Some ctxs last night that resolved VSSAF FHR 140s Cat 1 Ctxs occas mild Abd:  Gravid nt  PPROM at 27 6/7, Stable, continue present care SP BMZ, Latency Abx Vtx presentation [place] on 9/11 DL

## 2012-11-22 NOTE — Progress Notes (Signed)
Pt off the monitor after reassurring FHR  

## 2012-11-22 NOTE — Progress Notes (Signed)
Pt sitting up in the bed to eat breakfast  

## 2012-11-23 NOTE — Progress Notes (Signed)
No current c/o and no events overnight.  +FM.  No CTX or LOF since yesterday.  No abdominal pain/soreness.  VSS.  AF. FHT: Cat I.  No CTX or irritability. Abd: non-tender Ext: no c/c/e  32yo G1 at [redacted]w[redacted]d with PPROM -s/p BMZ -s/p latency abx -ultrasound 9/11

## 2012-11-24 NOTE — Progress Notes (Signed)
28 1/7 wks  Occasional leaking, no bleeding  VSS Afeb Uterus soft, NT Ext NT  A/P: PPROM         U/S tomorrow         S/P latency atb         Vtx on last check

## 2012-11-25 ENCOUNTER — Inpatient Hospital Stay (HOSPITAL_COMMUNITY): Payer: 59

## 2012-11-25 NOTE — Progress Notes (Signed)
Pt off the monitor, taken to u/s via w/c

## 2012-11-25 NOTE — Progress Notes (Signed)
Pt off the monitor after reassurring FHR  

## 2012-11-25 NOTE — Progress Notes (Signed)
S: Patient without complaints Minimal leakage. No pain  O: afebrile VSS General alert and oriented Lung CTAB Car RRR Abdomen is soft and non tender  IMPRESSION: IUP at 28 weeks 2 days PPROM  PLAN: No evidence of chorioamnionitis Ultrasound today Finished latency antibiotics

## 2012-11-26 MED ORDER — NYSTATIN-TRIAMCINOLONE 100000-0.1 UNIT/GM-% EX CREA: TOPICAL_CREAM | Freq: Three times a day (TID) | CUTANEOUS | Status: DC | PRN

## 2012-11-26 MED ORDER — ACYCLOVIR 5 % EX OINT
TOPICAL_OINTMENT | Freq: Every day | CUTANEOUS | Status: DC | PRN
  Administered 2012-11-30 – 2012-12-25 (×4): via TOPICAL

## 2012-11-26 NOTE — Progress Notes (Signed)
Toco applied after pt c/o feeling contractions that has not gone away after emptying bladder.

## 2012-11-26 NOTE — Progress Notes (Addendum)
No complaints/ + FM  AF, VSS  + FHT Gen - NAD Abd - gravid, NT PV - deferred  A/P:  PPROM S/p BMZ and Abx Continue bedrest [place] - transverse lie - need for c/s for delivery d/w pt.

## 2012-11-26 NOTE — Progress Notes (Signed)
Pt off the monitor after reassurring FHR  

## 2012-11-26 NOTE — Progress Notes (Signed)
Pt sitting up in the bed eating breakfast  

## 2012-11-26 NOTE — Progress Notes (Signed)
Pt taken off the monitor after reassurring FHR  

## 2012-11-26 NOTE — Progress Notes (Signed)
Antenatal Nutrition Assessment:  Currently  28 3/[redacted] weeks gestation, with PROM. Height  65" Weight 128 lbs pre-pregnancy weight 118 lbs.Pre-pregnancy  BMI 19.7  IBW 125 lbs  Total weight gain 10 lbs. Weight gain goals 25-35 lbs.   Estimated needs: 17-1900 kcal/day, 60-70 g grams protein/day, 2.1 liters fluid/day  Antenatal regular ( vegetarian)  Current diet prescription will provide for increased needs.  No abnormal nutrition related labs  Nutrition Dx: Increased nutrient needs r/t pregnancy and fetal growth requirements aeb [redacted] weeks gestation.  No educational needs assessed at this time.  Elisabeth Cara M.Odis Luster LDN Neonatal Nutrition Support Specialist Pager [phone]

## 2012-11-27 NOTE — Progress Notes (Signed)
Pt denies pain and ctx.  Did have slight increase in ctx overnight, that has improved this am.  No vb.  Slight fluid leak - clear.  + FM  AF, VSS  + FHT Gen - NAD Abd - gravid, NT Ext - NT  A/P:  PPROM Continue current care Transverse lie S/p BMZ & ABX

## 2012-11-28 MED ORDER — TETANUS-DIPHTH-ACELL PERTUSSIS 5-2.5-18.5 LF-MCG/0.5 IM SUSP
0.5000 mL | Freq: Once | INTRAMUSCULAR | Status: AC
  Administered 2012-11-28: 0.5 mL via INTRAMUSCULAR
  Filled 2012-11-28: qty 0.5

## 2012-11-28 NOTE — Progress Notes (Signed)
Pt denies pain and ctx. No vb. Slight fluid leak - clear. + FM   AF, VSS + FHT  Gen - NAD  Abd - gravid, NT  Ext - NT  PV - deferred  A/P: PPROM  Continue current care  Transverse lie  S/p BMZ & ABX NICU tour doday Tdap today

## 2012-11-29 ENCOUNTER — Inpatient Hospital Stay (HOSPITAL_COMMUNITY): Payer: 59

## 2012-11-29 LAB — CBC
HCT: 34.1 % — ABNORMAL LOW (ref 36.0–46.0)
MCH: 31.6 pg (ref 26.0–34.0)
MCV: 92.1 fL (ref 78.0–100.0)
MCV: 91.4 fL (ref 78.0–100.0)
Platelets: 177 10*3/uL (ref 150–400)
RBC: 3.54 MIL/uL — ABNORMAL LOW (ref 3.87–5.11)
RBC: 3.73 MIL/uL — ABNORMAL LOW (ref 3.87–5.11)
WBC: 8.9 10*3/uL (ref 4.0–10.5)
WBC: 11.9 10*3/uL — ABNORMAL HIGH (ref 4.0–10.5)

## 2012-11-29 LAB — TYPE AND SCREEN

## 2012-11-29 MED ORDER — MAGNESIUM SULFATE 40 MG/ML IJ SOLN: 4.0000 g | Freq: Once | INTRAMUSCULAR | Status: DC

## 2012-11-29 MED ORDER — MAGNESIUM SULFATE 40 G IN LACTATED RINGERS - SIMPLE: 2.0000 g/h | INTRAVENOUS | Status: DC

## 2012-11-29 MED ORDER — LACTATED RINGERS IV SOLN
INTRAVENOUS | Status: DC
  Administered 2012-11-29 – 2012-11-30 (×3): via INTRAVENOUS

## 2012-11-29 MED ORDER — LACTATED RINGERS IV BOLUS (SEPSIS): 250.0000 mL | Freq: Once | INTRAVENOUS | Status: AC

## 2012-11-29 MED ORDER — MAGNESIUM SULFATE 40 G IN LACTATED RINGERS - SIMPLE
2.0000 g/h | Freq: Once | INTRAVENOUS | Status: AC
  Filled 2012-11-29: qty 500

## 2012-11-29 MED ORDER — MAGNESIUM SULFATE BOLUS VIA INFUSION
4.0000 g | Freq: Once | INTRAVENOUS | Status: AC
  Administered 2012-11-29: 4 g via INTRAVENOUS
  Filled 2012-11-29: qty 500

## 2012-11-29 MED ORDER — LACTATED RINGERS IV BOLUS (SEPSIS)
500.0000 mL | Freq: Once | INTRAVENOUS | Status: AC
  Administered 2012-11-29: 500 mL via INTRAVENOUS

## 2012-11-29 NOTE — Progress Notes (Signed)
MD updated on pt status and pt cont to feel about 3-4 in one hour.  Per MD cont to watch for a couple more hours

## 2012-11-29 NOTE — Progress Notes (Signed)
Feeling better after IV fluids Contractions much less strong  VSS Afeb UCs about 3-5 / hour  A/P: Continue IV fluids and NPO         D/W patient magnesium sulfate for fetal neuroprotection if labors

## 2012-11-29 NOTE — Progress Notes (Signed)
Ur chart review completed.  

## 2012-11-29 NOTE — Progress Notes (Signed)
28 6/7 weeks Awoke this am with UCs, 8/10 No gushes-last leak yesterday, no bleeding, no N/V  VSS Afeb Lungs CTA Cor RRR Abd ut soft and NT  FHT reactive UCs 2/32min  A: PPROM     UCs  P: IV fluids     NPO     CBC, T&S     D/W patient delivery if UCs get stronger - D/W C/S and risks-infection, organ damage, bleeding/transfusion-HIV/Hep, organ damage, DVT/PE, pneumonia     All questions answered

## 2012-11-29 NOTE — Progress Notes (Signed)
UCs continued with IV fluids. Magnesium sulfate started for neuroprophylaxis. Patient states UCs now less intense, no leaking/bleeding  VSS Afeb Ut NT  FHT reactive UCs q4-6 min  Cx Cl/60 %/high station/soft  Results for orders placed during the hospital encounter of 11/13/12 (from the past 24 hour(s))  CBC     Status: Abnormal   Collection Time    11/29/12  8:30 AM      Result Value Range   WBC 8.9  4.0 - 10.5 K/uL   RBC 3.54 (*) 3.87 - 5.11 MIL/uL   Hemoglobin 11.2 (*) 12.0 - 15.0 g/dL   HCT 40.9 (*) 81.1 - 91.4 %   MCV 92.1  78.0 - 100.0 fL   MCH 31.6  26.0 - 34.0 pg   MCHC 34.4  30.0 - 36.0 g/dL   RDW 78.2  95.6 - 21.3 %   Platelets 177  150 - 400 K/uL  TYPE AND SCREEN     Status: None   Collection Time    11/29/12  8:30 AM      Result Value Range   ABO/RH(D) O POS     Antibody Screen NEG     Sample Expiration 12/02/2012    ABO/RH     Status: None   Collection Time    11/29/12  8:30 AM      Result Value Range   ABO/RH(D) O POS      A: PPROM with regular UCs and no cervical change     Currently no clinical evidence of chorioamnionitis  P: D/W Dr Claudean Severance - MFM     Will continue as above-magnesium sulfate for at least 12 hours, will repeat CBC      Will deliver if evidence of infection, cervical change      D/W patient and husband

## 2012-11-30 LAB — CBC
HCT: 32.8 % — ABNORMAL LOW (ref 36.0–46.0)
Hemoglobin: 11.6 g/dL — ABNORMAL LOW (ref 12.0–15.0)
MCH: 32.1 pg (ref 26.0–34.0)
MCHC: 35.4 g/dL (ref 30.0–36.0)
MCV: 90.9 fL (ref 78.0–100.0)
RDW: 12.7 % (ref 11.5–15.5)

## 2012-11-30 NOTE — Progress Notes (Signed)
S: Patient is resting this am. Denies Contractions. Denies foul smelling drainage. Reports good fetal movement.  Magnesium stopped at 0300.  O: afebrile VSS General alert and oriented FHR is 140s no decelerations Good variability Toco No contractions Abdomen is soft and non tender WBC is normal at 7.0  IMPRESSION: IUP at 29 weeks PPROM Breech  PLAN: No evidence of chorioamnionitis Continue to monitor C Section for delivery

## 2012-11-30 NOTE — Progress Notes (Signed)
TC to Dr Henderson Cloud for clarification of Magnesium order.  Pt currently receiving Magnesium sulfate 2 gms/hr for neuroprotection and greater than 12 hrs at this time.  Instruct RN to stop magnesium at 0300.

## 2012-12-01 ENCOUNTER — Inpatient Hospital Stay (HOSPITAL_COMMUNITY): Payer: 59

## 2012-12-01 NOTE — Progress Notes (Signed)
Patient ID: Roberta Wood, female   DOB: 05-Jun-1980, 32 y.o.   MRN: [id number] S: STILL SO LEAKAGE NO CTX O: AF VSS      GRAVID UTERUS NONTENDER      FHR TRACING LAST PM REACTIVE NO DECELS A: IUP AT 29.1 WITH PROM     BREECH P:  EXP MANAGEMENT       ARRANGE SONOGRAM

## 2012-12-01 NOTE — Progress Notes (Signed)
Roberta Wood  was seen today for an ultrasound appointment.  See full report in [facility].  Impression: Single IUP at 29 1/7 weeks PPROM [name] Breech presentation Active fetus with BPP of 8/8 AFI 7.1 cm.  Recommendations: Follow-up ultrasounds as clinically indicated.   Alpha Gula, MD

## 2012-12-01 NOTE — Progress Notes (Signed)
BPP 8/8.  AFI 7.  Dr Arelia Sneddon notified.

## 2012-12-01 NOTE — Progress Notes (Signed)
Pt to MFM for testing via WC

## 2012-12-01 NOTE — Progress Notes (Signed)
12/01/12 1400  Clinical Encounter Type  Visited With Patient and family together (mom)  Visit Type Follow-up;Spiritual support;Social support  Spiritual Encounters  Spiritual Needs Emotional   Ms Finnigan was in good spirits during this follow-up visit, presenting as calmer and more settled than on our previous visit.  Per pt, she feels good having made it through the stress of labor on Monday and knowing that she's approaching 30 weeks.  Provided pastoral listening, encouragement, and affirmation.  Pt is aware of ongoing chaplain availability, including contacting her clergy, if desired.  (She apparently was supposed to receive communion from her priest on Sunday, but she didn't see him.)  Please also page as needed.  [address] [place], [place] [phone]

## 2012-12-01 NOTE — Progress Notes (Signed)
Monitors off.  NST reactive and reassuring.

## 2012-12-02 NOTE — Progress Notes (Signed)
Patient ID: Roberta Wood, female   DOB: [birth year], 32 y.o.   MRN: [id number]   [redacted]w[redacted]d  S//min leaking, good FM  O//  BP 97/61  Pulse 70  Temp(Src) 98.1 F (36.7 C) (Oral)  Resp 18  Ht 5\' 5"  (1.651 m)  Wt 128 lb (58.06 kg)  BMI 21.3 kg/m2  SpO2 98%  LMP 05/11/2012  [place] yest>>>fr breech, AFI 7, BPP 8/8  A+P//  PPROM , now @ [redacted]w[redacted]d   CS for del

## 2012-12-03 NOTE — Progress Notes (Signed)
No complaints.  No painful CTX since Tuesday.  Rare CTX and scant LOF.  +FM.  No VB.  No abdominal tenderness or F/C.  VSS.  AF. FHT Cat I Abd: soft, NT, gravid Ext: no c/c/e  32yo G1 at [redacted]w[redacted]d with PPROM -s/p BMZ, abx -Breech at last U/S 9/17.  Will rescan if labor but pt has been counseled for C/S

## 2012-12-04 ENCOUNTER — Inpatient Hospital Stay (HOSPITAL_COMMUNITY): Payer: 59

## 2012-12-04 LAB — CBC WITH DIFFERENTIAL/PLATELET
Basophils Absolute: 0 10*3/uL (ref 0.0–0.1)
Basophils Relative: 0 % (ref 0–1)
Eosinophils Absolute: 0.1 10*3/uL (ref 0.0–0.7)
Eosinophils Relative: 1 % (ref 0–5)
Lymphs Abs: 2.3 10*3/uL (ref 0.7–4.0)
MCH: 32 pg (ref 26.0–34.0)
MCHC: 34.7 g/dL (ref 30.0–36.0)
MCV: 92.1 fL (ref 78.0–100.0)
Neutrophils Relative %: 68 % (ref 43–77)
Platelets: 181 10*3/uL (ref 150–400)
RDW: 12.8 % (ref 11.5–15.5)

## 2012-12-04 MED ORDER — TERBUTALINE SULFATE 1 MG/ML IJ SOLN
INTRAMUSCULAR | Status: AC
  Filled 2012-12-04: qty 1

## 2012-12-04 MED ORDER — TERBUTALINE SULFATE 1 MG/ML IJ SOLN
0.2500 mg | Freq: Once | INTRAMUSCULAR | Status: AC
  Administered 2012-12-04: 0.25 mg via SUBCUTANEOUS

## 2012-12-04 MED ORDER — LACTATED RINGERS IV SOLN
INTRAVENOUS | Status: DC
  Administered 2012-12-25 (×3): via INTRAVENOUS

## 2012-12-04 NOTE — Progress Notes (Signed)
No complaints. No painful CTX since Tuesday. Rare CTX and scant LOF. +FM. No VB. No abdominal tenderness or F/C.  VSS. AF.  FHT Cat I  Abd: soft, NT, gravid  Ext: no c/c/e  32yo G1 at [redacted]w[redacted]d with PPROM  -s/p BMZ, abx  -Breech at last U/S 9/17. Will rescan if labor but pt has been counseled for C/S

## 2012-12-04 NOTE — Progress Notes (Signed)
Patient's Husband called out to RN because patient was vomiting; RN enters room to find patient in bathroom continuing to vomit; patient states she was asleep then woke up feeling "drunk almost; dizzy and with tunnel vision" and then felt nauseated and proceeded to vomit; patient denies cramping or contractions at this time; gave patient Zofran 4mg  IV per MD order; assessed VS which were WNL for patient; after a few minutes patient stated she was feeling better and the nausea was going away; instructed patient or husband to call out if any change or cramping started again; patient and husband verbalized understanding

## 2012-12-04 NOTE — Progress Notes (Signed)
C/o crampy abdominal pain.  RN palpated a mild CTX.  No F/C.  No VB.  VSS.  AF. FHT Cat I  Improved s/p terbutaline x 1.   Will get CBC with diff Start IVF Get u/s for presentation Closely monitor and if symptoms recur/worsen, add MgSO4 for NP

## 2012-12-05 MED ORDER — TERBUTALINE SULFATE 1 MG/ML IJ SOLN
INTRAMUSCULAR | Status: AC
  Filled 2012-12-05: qty 1

## 2012-12-05 MED ORDER — TERBUTALINE SULFATE 1 MG/ML IJ SOLN
0.2500 mg | Freq: Once | INTRAMUSCULAR | Status: AC
  Administered 2012-12-05: 0.25 mg via SUBCUTANEOUS

## 2012-12-05 NOTE — Progress Notes (Signed)
CTX stopped and no more after the episode yesterday which resolved with IVF and terb x1.  Scant LOF. +FM. No VB. No abdominal tenderness or F/C.   VSS. AF.  FHT Cat I  Abd: soft, NT, gravid  Ext: no c/c/e   32yo G1 at [redacted]w[redacted]d with PPROM  -s/p BMZ, abx  -Hand and foot presentation at last U/S 9/20. Will rescan if labor but pt has been counseled for C/S

## 2012-12-06 MED ORDER — INFLUENZA VAC SPLIT QUAD 0.5 ML IM SUSP
0.5000 mL | INTRAMUSCULAR | Status: AC
  Administered 2012-12-07: 0.5 mL via INTRAMUSCULAR
  Filled 2012-12-06: qty 0.5

## 2012-12-06 NOTE — Progress Notes (Signed)
Perinatal educator, Lori Davenport, came per RN request to provide videos and booklets to the patient since she is unable to take birthing classes due to her stay in the hospital. Pt. Pleased to have material.  

## 2012-12-06 NOTE — Progress Notes (Signed)
[redacted]w[redacted]d  S//had some irreg ctx over weekend, calm now, ask re flu shot  O//  BP 105/55  Pulse 85  Temp(Src) 98.1 F (36.7 C) (Oral)  Resp 18  Ht 5\' 5"  (1.651 m)  Wt 128 lb (58.06 kg)  BMI 21.3 kg/m2  SpO2 98%  LMP 05/11/2012  FHR stable  A+P//[redacted]w[redacted]d,PPROM,stable, rec flu shot

## 2012-12-07 NOTE — Progress Notes (Signed)
Patient ID: Roberta Wood, female   DOB: [birth year], 32 y.o.   MRN: [id number] Pt without complaints Big gush of clear fluid last night Minimal leaking now GFM Rare ctxs VSSAF FHR cat I Ctxs rare Abd Gravid nontender  PPROM @ 30 0/7 Compound presentation SP BMZ [place] for EFM / position tomorrow Mag for CP prohpylaxis if labors DL

## 2012-12-07 NOTE — Progress Notes (Signed)
Patient called out asking for RN, states "I just had a huge gush of fluid to come out that wet the bed pad."   Patients peripad is saturated with clear odorless fluid, monitors applied to patient and NST reactive and reassuring.  Monitors removed and patient afebrile.

## 2012-12-08 ENCOUNTER — Inpatient Hospital Stay (HOSPITAL_BASED_OUTPATIENT_CLINIC_OR_DEPARTMENT_OTHER): Payer: 59

## 2012-12-08 ENCOUNTER — Ambulatory Visit (HOSPITAL_COMMUNITY): Payer: 59

## 2012-12-08 ENCOUNTER — Inpatient Hospital Stay (HOSPITAL_COMMUNITY): Payer: 59

## 2012-12-08 LAB — GLUCOSE TOLERANCE, 1 HOUR: Glucose, 1 Hour GTT: 83 mg/dL (ref 70–140)

## 2012-12-08 NOTE — Progress Notes (Addendum)
Pt reports occ leaking.  No pain or VB.  Good FM  + FHT Gen - NAD Abd - gravid, NT Ext - NT, no edema  [place] - breech with + interval growth.  AFI 6.7cm (<3%).  Normal dopplers  A/P:  PPROM S/p BMZ, Mag, & latency abx Continue bedrest Breech - c/s for delivery Rpt [place] in 2-3 wks per MFM recs 1 hr gtt

## 2012-12-08 NOTE — Progress Notes (Deleted)
Pt pushing x 1 1/2 hrs.  Pushing with good effort and experiencing LLQ pain and exhaustion.  FHT reassuring Toco Q1-4 Cvx c/c/+1 - little change in station  A/P:  rec pt try a few pushes on her left side.   Will recheck in 20-30min, pt request c-section if no change in station Agree with plan

## 2012-12-09 NOTE — Progress Notes (Signed)
Patient ID: Roberta Wood, female   DOB: 10/28/1980, 32 y.o.   MRN: [id number] S: MINIMAL LEAKAGE NO CONTRACTIONS GOOD FETAL MOVEMENT O: AFF VSS      GRAVID UTERUS NONTENDER      FHR CAT ONE      SONOGRAM YESTERDAY:  AFI 6.7 WHICH IS < 3 %TILE,  FRANK BREECH      BPP 8/8  NORMAL DOPLERS      EFW 21 %TILE  AC 8%TILE      F/U SONO IN 2-3 WEEKS FOR GROWTH A:  IUP AT 30.2 WITH PROM P: CONTINUE EXP MANAGEMENT

## 2012-12-09 NOTE — Progress Notes (Signed)
12/09/12 1200  Clinical Encounter Type  Visited With Patient and family together (husband and mom)  Visit Type Follow-up;Spiritual support;Social support  Spiritual Encounters  Spiritual Needs Emotional   Family was receptive to follow-up visit and appreciated opportunity to share and process feelings about all the unknowns related to timing, labor, and delivery.  Roberta Wood states that she not attached to the idea of a vaginal delivery or c-section, but that she feels somewhat nervous about not knowing what will happen or what experience to expect. I encouraged her to share her questions and concerns with RNs and MDs so that she can continue to glean more information.   Pt and husband especially want to have a NICU tour together prior to delivery.    Provided pastoral reflection, encouragement, and affirmation.  Will continue to follow for support.  [address] [place], [place] [phone]

## 2012-12-09 NOTE — Progress Notes (Signed)
Patient stated that she had another large gush of fluid; she stated it soaked through her pad, pants, and bed pad; RN evaluated fluid colored and noted to be clear; pt c/o crampiness but states it is the same feeling she has been having off and on.

## 2012-12-09 NOTE — Progress Notes (Signed)
Patient c/o of mild cramping in abdomen that she rates 5/10 but she states they are very sporadic and are not constant; during NST no UI or UCs noted; FHR noted to be 140s with 15x15s and 2 prolonged decels but baby recovers well to baseline and continues to have 15x15s afterwards; Called Dr. Arelia Sneddon to make him aware of prolonged decels and no new orders were given at this time. He was also informed of pt occasional cramping and stable VS.

## 2012-12-10 NOTE — Progress Notes (Signed)
S:  Patient is doing well. Mild Cramping. Good Fetal movement.  O: afebrile Vital signs Stable FHR is good with normal baseline and moderate variability Abdomen is soft and non tender  IMPRESSION: PPROM IUP at 30 weeks 3 days  PLAN: Continue expectant management  Probable C section for delivery

## 2012-12-11 NOTE — Progress Notes (Signed)
S: No complaints.  Good fetal movement. Feels better. O: afebrile VSS General alert and oriented Lung CTAB Car RRR FHR is reassuring Toco No contractions  IMPRESSION: PPROM IUP at 30 weeks 3 days  PLAN: Continue expectant management  Probable C Section for delivery

## 2012-12-11 NOTE — Lactation Note (Signed)
Lactation Consultation Note  Pt requested LC to address some questions about breasts pumps and milk expression for her baby who would most likely be delivered preterm.  Educated her on the need for early pumping and hand expression.  Referred her to [employer]'s hand expression video. Discouraged her hand expressing at this point.  Also talked to her about the different pumps that her insurance company had to offer and encouraged her to compare them on-line.    Patient Name: [name]'U Date: 12/11/2012     Maternal Data    Feeding    LATCH Score/Interventions                      Lactation Tools Discussed/Used     Consult Status      Soyla Dryer 12/11/2012, 5:15 PM

## 2012-12-12 NOTE — Progress Notes (Signed)
S:  Patient is doing well. Reports good fetal movement.  Leaking if minimal.  Only noticed 2 contractions yesterday. Denies any vaginal odor.  O:  Afebrile Vital signs stable Abdomen is soft and non tender  IMPRESSION: PPROM  IUP at 30 weeks and 5 days  PLAN: No evidence of chorioamnionitis Continue expectant management Possible C Section for delivery

## 2012-12-13 MED ORDER — FLUCONAZOLE 150 MG PO TABS
150.0000 mg | ORAL_TABLET | Freq: Once | ORAL | Status: AC
  Administered 2012-12-13: 150 mg via ORAL
  Filled 2012-12-13: qty 1

## 2012-12-13 NOTE — Progress Notes (Deleted)
Pt comfortable w/ epidural.  FHT reassuring Toco Q3-5 Cvx 5-6/C/-2 IUPC placed w/out resistance  A/P: No change Will augment with pitocin Exp mngt

## 2012-12-13 NOTE — Progress Notes (Signed)
No complaints.  Denies ctx & Vb.  Good FM  AF, VSS Gen - NAD Abd - gravid,NT Ext - NT, no edema PV - deferred  A/P:  PPROM, 30+6 wks Continue bedrest S/p Mag, BMZ, Abx Breech by last week [place] - c-section for delivery

## 2012-12-13 NOTE — Progress Notes (Signed)
Ur chart review completed.  

## 2012-12-14 NOTE — Progress Notes (Signed)
Patient ID: Roberta Wood, female   DOB: Jun 24, 1980, 32 y.o.   MRN: [id number] S: SOME CTX'S LAST PM BETTER NOW  STILL SOME LEAKAGE O: AF VSS      GRAVID UTERUS NONTENDER      FHR LAST PM REACTIVE NO DECELS  NOT CTX'S NOTED A: IUP AT 31 WEEKS WITH PROM P: EXP MANAGEMENT

## 2012-12-15 ENCOUNTER — Inpatient Hospital Stay (HOSPITAL_COMMUNITY): Payer: 59

## 2012-12-15 NOTE — Progress Notes (Signed)
Pt feeling cramps lasting 30-45secs offered reassurance to pt and rapplied efm

## 2012-12-15 NOTE — Progress Notes (Signed)
Roberta Wood  was seen today for an ultrasound appointment.  See full report in [facility].  Impression: Single IUP at 31 1/7 weeks PPROM Limited ultrasound performed for amniotic fluid assessment [name] Breech presentation Amniotic fluid volume subjectively low (AFI 5.78 cm)  Recommendations: Recommend follow-up ultrasound examination in 2 weeks for growth  Alpha Gula, MD

## 2012-12-15 NOTE — Progress Notes (Signed)
Patient ID: Roberta Wood, female   DOB: 1980/05/22, 32 y.o.   MRN: [id number] Pt without complaints GFM Big gush last night clear fluid and thinks baby has changed positions again Occas ctxs VSSAF  FHR reassuring, rare ctxs Abd Gravid nt Neg homans Bil  PPROM at 24 1/7 [place] today for position Cont present care DL

## 2012-12-16 NOTE — Progress Notes (Signed)
Mild CTX last pm; none currently. Scant LOF. +FM. No VB. No abdominal tenderness or F/C.   VSS. AF.  FHT Cat I (on monitor currently)  Abd: soft, NT, gravid  Ext: no c/c/e   32yo G1 at [redacted]w[redacted]d with PPROM  -s/p BMZ, abx  -Ultrasound yesterday showed frank breech presentation; C/S for labor

## 2012-12-17 LAB — TYPE AND SCREEN
ABO/RH(D): O POS
Antibody Screen: NEGATIVE

## 2012-12-17 LAB — CBC
HCT: 33 % — ABNORMAL LOW (ref 36.0–46.0)
Hemoglobin: 11.3 g/dL — ABNORMAL LOW (ref 12.0–15.0)
MCH: 31.4 pg (ref 26.0–34.0)
MCHC: 34.2 g/dL (ref 30.0–36.0)
RBC: 3.6 MIL/uL — ABNORMAL LOW (ref 3.87–5.11)

## 2012-12-17 MED ORDER — FLUCONAZOLE 150 MG PO TABS
150.0000 mg | ORAL_TABLET | Freq: Once | ORAL | Status: AC
  Administered 2012-12-17: 150 mg via ORAL
  Filled 2012-12-17: qty 1

## 2012-12-17 MED ORDER — LACTATED RINGERS IV BOLUS (SEPSIS)
250.0000 mL | Freq: Once | INTRAVENOUS | Status: AC
  Administered 2012-12-17: 250 mL via INTRAVENOUS

## 2012-12-17 MED ORDER — LACTATED RINGERS IV SOLN
INTRAVENOUS | Status: DC
  Administered 2012-12-17 – 2012-12-18 (×2): via INTRAVENOUS

## 2012-12-17 NOTE — Progress Notes (Signed)
31 3/7 weeks C/O some vaginal itching and slight malodorous discharge. This occurred before and resolved with diflucan. No abdominal pain, no bleeding. Continues to leak small amounts, no gushes.  VSS Afeb Uterus soft and NT  NST reactive  Results for orders placed during the hospital encounter of 11/13/12 (from the past 24 hour(s))  CBC     Status: Abnormal   Collection Time    12/17/12  3:30 PM      Result Value Range   WBC 9.0  4.0 - 10.5 K/uL   RBC 3.60 (*) 3.87 - 5.11 MIL/uL   Hemoglobin 11.3 (*) 12.0 - 15.0 g/dL   HCT 16.1 (*) 09.6 - 04.5 %   MCV 91.7  78.0 - 100.0 fL   MCH 31.4  26.0 - 34.0 pg   MCHC 34.2  30.0 - 36.0 g/dL   RDW 40.9  81.1 - 91.4 %   Platelets 196  150 - 400 K/uL   A: PPROM  31 3/7 wks     Probable vaginitis  P: Continue present care      Breech - C/S for delivery      Diflucan po x 1      D/W patient NW:GNFA watch closely for any sxs of chorioamnionitis

## 2012-12-18 ENCOUNTER — Inpatient Hospital Stay (HOSPITAL_COMMUNITY): Payer: 59

## 2012-12-18 LAB — CBC
Hemoglobin: 10.8 g/dL — ABNORMAL LOW (ref 12.0–15.0)
MCH: 32.1 pg (ref 26.0–34.0)
MCV: 92 fL (ref 78.0–100.0)
Platelets: 182 10*3/uL (ref 150–400)
RBC: 3.36 MIL/uL — ABNORMAL LOW (ref 3.87–5.11)
RDW: 13.1 % (ref 11.5–15.5)
WBC: 9.3 10*3/uL (ref 4.0–10.5)

## 2012-12-18 MED ORDER — NYSTATIN-TRIAMCINOLONE 100000-0.1 UNIT/GM-% EX CREA
TOPICAL_CREAM | Freq: Two times a day (BID) | CUTANEOUS | Status: DC
  Administered 2012-12-18 – 2012-12-25 (×13): via TOPICAL
  Filled 2012-12-18: qty 15

## 2012-12-18 NOTE — Progress Notes (Signed)
Pink on wipe, unsure if from vagina or from rectum

## 2012-12-18 NOTE — Progress Notes (Signed)
Pt to her baby shower via w/c

## 2012-12-18 NOTE — Progress Notes (Signed)
31 4/7 weeks No C/O, no bleeding  VSS Afeb Uterus soft, NT  FHT good accels, occasional variables UCs 1-2 / hr  Results for orders placed during the hospital encounter of 11/13/12 (from the past 24 hour(s))  CBC     Status: Abnormal   Collection Time    12/17/12  3:30 PM      Result Value Range   WBC 9.0  4.0 - 10.5 K/uL   RBC 3.60 (*) 3.87 - 5.11 MIL/uL   Hemoglobin 11.3 (*) 12.0 - 15.0 g/dL   HCT 40.9 (*) 81.1 - 91.4 %   MCV 91.7  78.0 - 100.0 fL   MCH 31.4  26.0 - 34.0 pg   MCHC 34.2  30.0 - 36.0 g/dL   RDW 78.2  95.6 - 21.3 %   Platelets 196  150 - 400 K/uL  TYPE AND SCREEN     Status: None   Collection Time    12/17/12  3:30 PM      Result Value Range   ABO/RH(D) O POS     Antibody Screen NEG     Sample Expiration 12/20/2012    CBC     Status: Abnormal   Collection Time    12/18/12  5:29 AM      Result Value Range   WBC 9.3  4.0 - 10.5 K/uL   RBC 3.36 (*) 3.87 - 5.11 MIL/uL   Hemoglobin 10.8 (*) 12.0 - 15.0 g/dL   HCT 08.6 (*) 57.8 - 46.9 %   MCV 92.0  78.0 - 100.0 fL   MCH 32.1  26.0 - 34.0 pg   MCHC 35.0  30.0 - 36.0 g/dL   RDW 62.9  52.8 - 41.3 %   Platelets 182  150 - 400 K/uL   A: PPROM  P: U/S with BPP today

## 2012-12-18 NOTE — Progress Notes (Signed)
MD notified that prelim verbal BPP was 8/8 and pt getting ready for w/c ride to baby shower in the education room.  Pt instructed to return to unit if pain or VB.  Pt verlbalizes understanding.

## 2012-12-18 NOTE — Progress Notes (Signed)
Pt returning from the baby shower

## 2012-12-19 MED ORDER — LACTATED RINGERS IV BOLUS (SEPSIS)
500.0000 mL | Freq: Once | INTRAVENOUS | Status: AC
  Administered 2012-12-19: 17:00:00 via INTRAVENOUS

## 2012-12-19 NOTE — Progress Notes (Signed)
31 5/7 weeks Small gush this am Vag discharge improving  VSS Afeb Uterus NT, soft FHT reactive on last pm strip  BPP 8/8 yesterday Breech  A: PPROM     Breech  P: Continue present care

## 2012-12-19 NOTE — Progress Notes (Signed)
MD informed of pt complaints and contractions.   Orders received for LR bolus

## 2012-12-20 NOTE — Progress Notes (Signed)
Pt was in relatively good spirits and appears to have good support from family who was visiting.  She is grateful to have kept her baby in for as long as she has, but is feeling increased frustration at the "false alarms" that she has been having in the past week.  I offered reflective listening and affirmation of her feelings.  We will continue to follow up with pt and family, but please also page as needs arise.   [employer] Pager, [phone] 1:38 PM   12/20/12 1300  Clinical Encounter Type  Visited With Patient and family together  Visit Type Spiritual support

## 2012-12-20 NOTE — Progress Notes (Signed)
Patient ID: Roberta Wood, female   DOB: Dec 22, 1980, 32 y.o.   MRN: [id number] S: SOME CONTRACTIONS OVER THE WEEKEND.  FEEL LESS THIS MORNING BUT DOES COMPLAIN OF PRESSURE O:  AFF VSS       GRAVID UTERUS NONTENDER       STERILE SPECULUM EXAM CERVIX LOOKS FINGERTIP BUT LONG MINIMAL FLUID LEAKAGE        NOTED       BPP THIS WEEKEND 8/8       FHR TRACING LAST PM REACTIVE NO CTXS NOTED A:  IUP AT 31.6 WITH PROM AND BREECH P:  EXP MANAGEMENT

## 2012-12-20 NOTE — Progress Notes (Signed)
Patient ID: Roberta Wood, female   DOB: [birth year], 32 y.o.   MRN: [id number] Check with MFM tomorrow to see if they agree with cesarean section at 34 weeks

## 2012-12-20 NOTE — Progress Notes (Signed)
Ur chart review completed.  

## 2012-12-21 ENCOUNTER — Encounter (HOSPITAL_COMMUNITY): Payer: 59

## 2012-12-21 NOTE — Progress Notes (Signed)
S: patient is doing well. Minimal leakage. Good fetal movement. Denies pressure.  O:  Afebrile VSS Abdomen is soft and non tender  IMPRESSION: IUP at 32 weeks PPROM  PLAN: Continue expectant management  Breech C Section for delivery

## 2012-12-21 NOTE — Progress Notes (Signed)
Spoke to Dr Otho Perl Dr Arelia Sneddon had requested MFM consult to recommend date of delivery Dr. Otho Perl spoke to me via telephone. He felt formal consult is not necessary. Recommends delivery at 34 weeks according to standard guidelines.

## 2012-12-22 ENCOUNTER — Encounter (HOSPITAL_COMMUNITY): Payer: Self-pay | Admitting: *Deleted

## 2012-12-22 NOTE — Progress Notes (Signed)
12/22/12 1600  Clinical Encounter Type  Visited With Family (pt's mom)  Visit Type Spiritual support;Social support  Spiritual Encounters  Spiritual Needs Emotional   Visited with pt's mom in the hallway, offering pastoral support and encouragement as she shared updates and gratitude for prayer support.  Will continue to follow Aundra Millet and family for support.  [address] [place], [place] [phone]

## 2012-12-22 NOTE — Progress Notes (Signed)
Minimal cramping this AM. Not severe enough for medication. Scant LOF. +FM. No VB. No abdominal tenderness or F/C.   VSS. AF.  FHT Cat I    Abd: soft, NT, gravid  Ext: no c/c/e   32yo G1 at [redacted]w[redacted]d with PPROM  -s/p BMZ, abx  -Breech presentation; C/S for labor -Rpt U/S for growth in 1 week

## 2012-12-23 ENCOUNTER — Inpatient Hospital Stay (HOSPITAL_COMMUNITY): Payer: 59

## 2012-12-23 NOTE — Progress Notes (Signed)
Roberta Wood  was seen today for an ultrasound appointment.  See full report in [facility].  Impression: Single IUP a 32 2/7 weeks PROM, single umbilical artery Breech presentation Active fetus with BPP of 8/8 Amniotic fluid subjectively decreased (AFI 8.7 cm)  Recommendations: Follow up ultrasound in 1 week for growth.  Alpha Gula, MD

## 2012-12-23 NOTE — Progress Notes (Signed)
Patient ID: Roberta Wood, female   DOB: 11-26-1980, 32 y.o.   MRN: [id number] Pt without complaints GFM Continues to leak clear fluid occas Ctxs  VSSAF FHR cat 1  Abd Gravid, nt Neg homans Bil  PPROM at 32 2/7 Continue present care BPP this week C/S at 34 weeks

## 2012-12-24 NOTE — Progress Notes (Signed)
Pt feeling mild cramping this am.  Small spot of bloody mucus w/ urinating but none since.  No ctx or pain now  FHT reassuring Toco none Cvx deferred, PPROM  A/P:  Bedrest Breech - c-section at 34 wks or prn w/ labor/chorio

## 2012-12-24 NOTE — Progress Notes (Signed)
12/24/12 1400  Clinical Encounter Type  Visited With Patient and family together (mom)  Visit Type Spiritual support;Social support  Spiritual Encounters  Spiritual Needs Emotional   Offered encouragement and affirmation as Aundra Millet prepares emotionally and spiritually for delivery in coming weeks.  She may request chaplain assistance in contacting priest for support and prayer closer to delivery time.  Following.  [address] [place], [place] [phone]

## 2012-12-25 ENCOUNTER — Inpatient Hospital Stay (HOSPITAL_COMMUNITY): Payer: 59 | Admitting: Anesthesiology

## 2012-12-25 ENCOUNTER — Encounter (HOSPITAL_COMMUNITY)
Admission: AD | Disposition: A | Discharge status: Home / Self Care | Payer: Self-pay | Source: Ambulatory Visit | Attending: Obstetrics and Gynecology

## 2012-12-25 ENCOUNTER — Encounter (HOSPITAL_COMMUNITY): Payer: 59 | Admitting: Anesthesiology

## 2012-12-25 ENCOUNTER — Encounter (HOSPITAL_COMMUNITY): Payer: Self-pay | Admitting: *Deleted

## 2012-12-25 LAB — CBC
HCT: 33.7 % — ABNORMAL LOW (ref 36.0–46.0)
Hemoglobin: 11.7 g/dL — ABNORMAL LOW (ref 12.0–15.0)
MCHC: 34.7 g/dL (ref 30.0–36.0)
MCV: 91.1 fL (ref 78.0–100.0)
RBC: 3.7 MIL/uL — ABNORMAL LOW (ref 3.87–5.11)
RDW: 12.9 % (ref 11.5–15.5)
WBC: 11 10*3/uL — ABNORMAL HIGH (ref 4.0–10.5)

## 2012-12-25 LAB — TYPE AND SCREEN: Antibody Screen: NEGATIVE

## 2012-12-25 SURGERY — Surgical Case
Anesthesia: Spinal | Site: Abdomen | Wound class: Clean Contaminated

## 2012-12-25 MED ORDER — NALBUPHINE HCL 10 MG/ML IJ SOLN
5.0000 mg | INTRAMUSCULAR | Status: DC | PRN
  Filled 2012-12-25: qty 1

## 2012-12-25 MED ORDER — OXYTOCIN 10 UNIT/ML IJ SOLN
40.0000 [IU] | INTRAVENOUS | Status: DC | PRN
  Administered 2012-12-25: 40 [IU] via INTRAVENOUS

## 2012-12-25 MED ORDER — BUPIVACAINE IN DEXTROSE 0.75-8.25 % IT SOLN
INTRATHECAL | Status: DC | PRN
  Administered 2012-12-25: 10.5 mg via INTRATHECAL

## 2012-12-25 MED ORDER — NALOXONE HCL 1 MG/ML IJ SOLN: 1.0000 ug/kg/h | INTRAMUSCULAR | Status: DC | PRN

## 2012-12-25 MED ORDER — PHENYLEPHRINE 40 MCG/ML (10ML) SYRINGE FOR IV PUSH (FOR BLOOD PRESSURE SUPPORT)
PREFILLED_SYRINGE | INTRAVENOUS | Status: AC
  Filled 2012-12-25: qty 5

## 2012-12-25 MED ORDER — SCOPOLAMINE 1 MG/3DAYS TD PT72: 1.0000 | MEDICATED_PATCH | Freq: Once | TRANSDERMAL | Status: DC

## 2012-12-25 MED ORDER — OXYTOCIN 10 UNIT/ML IJ SOLN
INTRAMUSCULAR | Status: AC
  Filled 2012-12-25: qty 4

## 2012-12-25 MED ORDER — DEXTROSE IN LACTATED RINGERS 5 % IV SOLN
INTRAVENOUS | Status: DC
  Administered 2012-12-26: 09:00:00 via INTRAVENOUS

## 2012-12-25 MED ORDER — FENTANYL CITRATE 0.05 MG/ML IJ SOLN
INTRAMUSCULAR | Status: DC | PRN
  Administered 2012-12-25: 75 ug via INTRAVENOUS

## 2012-12-25 MED ORDER — CLINDAMYCIN PHOSPHATE 900 MG/50ML IV SOLN
900.0000 mg | Freq: Once | INTRAVENOUS | Status: AC
  Administered 2012-12-25: 900 mg via INTRAVENOUS
  Filled 2012-12-25: qty 50

## 2012-12-25 MED ORDER — OXYTOCIN 40 UNITS IN LACTATED RINGERS INFUSION - SIMPLE MED: 62.5000 mL/h | INTRAVENOUS | Status: AC

## 2012-12-25 MED ORDER — FENTANYL CITRATE 0.05 MG/ML IJ SOLN
INTRAMUSCULAR | Status: DC | PRN
  Administered 2012-12-25: 25 ug via INTRATHECAL

## 2012-12-25 MED ORDER — OXYCODONE-ACETAMINOPHEN 5-325 MG PO TABS
1.0000 | ORAL_TABLET | ORAL | Status: DC | PRN
  Administered 2012-12-26: 1 via ORAL
  Administered 2012-12-26: 2 via ORAL
  Administered 2012-12-26 (×2): 1 via ORAL
  Administered 2012-12-27: 2 via ORAL
  Administered 2012-12-27: 1 via ORAL
  Filled 2012-12-25 (×3): qty 2
  Filled 2012-12-25 (×3): qty 1

## 2012-12-25 MED ORDER — ONDANSETRON HCL 4 MG/2ML IJ SOLN: 4.0000 mg | Freq: Three times a day (TID) | INTRAMUSCULAR | Status: DC | PRN

## 2012-12-25 MED ORDER — MEPERIDINE HCL 25 MG/ML IJ SOLN: 6.2500 mg | INTRAMUSCULAR | Status: DC | PRN

## 2012-12-25 MED ORDER — KETOROLAC TROMETHAMINE 60 MG/2ML IM SOLN
60.0000 mg | Freq: Once | INTRAMUSCULAR | Status: AC | PRN
  Administered 2012-12-25: 60 mg via INTRAMUSCULAR

## 2012-12-25 MED ORDER — PHENYLEPHRINE HCL 10 MG/ML IJ SOLN
INTRAMUSCULAR | Status: DC | PRN
  Administered 2012-12-25: 40 ug via INTRAVENOUS
  Administered 2012-12-25 (×4): 80 ug via INTRAVENOUS

## 2012-12-25 MED ORDER — SIMETHICONE 80 MG PO CHEW
80.0000 mg | CHEWABLE_TABLET | Freq: Three times a day (TID) | ORAL | Status: DC
  Administered 2012-12-26 – 2012-12-29 (×11): 80 mg via ORAL
  Filled 2012-12-25 (×11): qty 1

## 2012-12-25 MED ORDER — BUTORPHANOL TARTRATE 1 MG/ML IJ SOLN: 1.0000 mg | Freq: Once | INTRAMUSCULAR | Status: DC

## 2012-12-25 MED ORDER — NALOXONE HCL 0.4 MG/ML IJ SOLN: 0.4000 mg | INTRAMUSCULAR | Status: DC | PRN

## 2012-12-25 MED ORDER — DIPHENHYDRAMINE HCL 25 MG PO CAPS
25.0000 mg | ORAL_CAPSULE | ORAL | Status: DC | PRN
  Filled 2012-12-25: qty 1

## 2012-12-25 MED ORDER — DIPHENHYDRAMINE HCL 50 MG/ML IJ SOLN: 12.5000 mg | INTRAMUSCULAR | Status: DC | PRN

## 2012-12-25 MED ORDER — KETOROLAC TROMETHAMINE 60 MG/2ML IM SOLN
INTRAMUSCULAR | Status: AC
  Administered 2012-12-25: 60 mg via INTRAMUSCULAR
  Filled 2012-12-25: qty 2

## 2012-12-25 MED ORDER — FENTANYL CITRATE 0.05 MG/ML IJ SOLN
INTRAMUSCULAR | Status: AC
  Administered 2012-12-25: 25 ug via INTRAVENOUS
  Filled 2012-12-25: qty 2

## 2012-12-25 MED ORDER — SENNOSIDES-DOCUSATE SODIUM 8.6-50 MG PO TABS
2.0000 | ORAL_TABLET | ORAL | Status: DC
  Administered 2012-12-26 – 2012-12-29 (×4): 2 via ORAL
  Filled 2012-12-25 (×4): qty 2

## 2012-12-25 MED ORDER — FENTANYL CITRATE 0.05 MG/ML IJ SOLN
25.0000 ug | INTRAMUSCULAR | Status: DC | PRN
  Administered 2012-12-25: 25 ug via INTRAVENOUS
  Administered 2012-12-25: 50 ug via INTRAVENOUS

## 2012-12-25 MED ORDER — SIMETHICONE 80 MG PO CHEW: 80.0000 mg | CHEWABLE_TABLET | ORAL | Status: DC | PRN

## 2012-12-25 MED ORDER — WITCH HAZEL-GLYCERIN EX PADS: 1.0000 "application " | MEDICATED_PAD | CUTANEOUS | Status: DC | PRN

## 2012-12-25 MED ORDER — MORPHINE SULFATE 0.5 MG/ML IJ SOLN
INTRAMUSCULAR | Status: AC
  Filled 2012-12-25: qty 10

## 2012-12-25 MED ORDER — MEASLES, MUMPS & RUBELLA VAC ~~LOC~~ INJ
0.5000 mL | INJECTION | Freq: Once | SUBCUTANEOUS | Status: DC
  Filled 2012-12-25: qty 0.5

## 2012-12-25 MED ORDER — MORPHINE SULFATE (PF) 0.5 MG/ML IJ SOLN
INTRAMUSCULAR | Status: DC | PRN
  Administered 2012-12-25: 4.8 ug via INTRAVENOUS

## 2012-12-25 MED ORDER — LACTATED RINGERS IV SOLN
INTRAVENOUS | Status: DC | PRN
  Administered 2012-12-25: 20:00:00 via INTRAVENOUS

## 2012-12-25 MED ORDER — MEDROXYPROGESTERONE ACETATE 150 MG/ML IM SUSP: 150.0000 mg | INTRAMUSCULAR | Status: DC | PRN

## 2012-12-25 MED ORDER — ONDANSETRON HCL 4 MG/2ML IJ SOLN: 4.0000 mg | INTRAMUSCULAR | Status: DC | PRN

## 2012-12-25 MED ORDER — SODIUM CHLORIDE 0.9 % IJ SOLN: 3.0000 mL | INTRAMUSCULAR | Status: DC | PRN

## 2012-12-25 MED ORDER — KETOROLAC TROMETHAMINE 30 MG/ML IJ SOLN: 30.0000 mg | Freq: Four times a day (QID) | INTRAMUSCULAR | Status: AC | PRN

## 2012-12-25 MED ORDER — MENTHOL 3 MG MT LOZG: 1.0000 | LOZENGE | OROMUCOSAL | Status: DC | PRN

## 2012-12-25 MED ORDER — IBUPROFEN 600 MG PO TABS
600.0000 mg | ORAL_TABLET | Freq: Four times a day (QID) | ORAL | Status: DC
  Administered 2012-12-26 – 2012-12-29 (×14): 600 mg via ORAL
  Filled 2012-12-25 (×14): qty 1

## 2012-12-25 MED ORDER — ONDANSETRON HCL 4 MG/2ML IJ SOLN
INTRAMUSCULAR | Status: AC
  Filled 2012-12-25: qty 2

## 2012-12-25 MED ORDER — HYDROMORPHONE HCL PF 1 MG/ML IJ SOLN
1.0000 mg | INTRAMUSCULAR | Status: AC | PRN
  Administered 2012-12-26 (×2): 1 mg via INTRAVENOUS
  Filled 2012-12-25 (×2): qty 1

## 2012-12-25 MED ORDER — ONDANSETRON HCL 4 MG/2ML IJ SOLN
INTRAMUSCULAR | Status: DC | PRN
  Administered 2012-12-25: 4 mg via INTRAMUSCULAR

## 2012-12-25 MED ORDER — SCOPOLAMINE 1 MG/3DAYS TD PT72
MEDICATED_PATCH | TRANSDERMAL | Status: AC
  Filled 2012-12-25: qty 1

## 2012-12-25 MED ORDER — TETANUS-DIPHTH-ACELL PERTUSSIS 5-2.5-18.5 LF-MCG/0.5 IM SUSP: 0.5000 mL | Freq: Once | INTRAMUSCULAR | Status: DC

## 2012-12-25 MED ORDER — 0.9 % SODIUM CHLORIDE (POUR BTL) OPTIME
TOPICAL | Status: DC | PRN
  Administered 2012-12-25: 200 mL
  Administered 2012-12-25: 300 mL

## 2012-12-25 MED ORDER — ONDANSETRON HCL 4 MG PO TABS: 4.0000 mg | ORAL_TABLET | ORAL | Status: DC | PRN

## 2012-12-25 MED ORDER — METOCLOPRAMIDE HCL 5 MG/ML IJ SOLN: 10.0000 mg | Freq: Three times a day (TID) | INTRAMUSCULAR | Status: DC | PRN

## 2012-12-25 MED ORDER — DIPHENHYDRAMINE HCL 50 MG/ML IJ SOLN: 25.0000 mg | INTRAMUSCULAR | Status: DC | PRN

## 2012-12-25 MED ORDER — DIBUCAINE 1 % RE OINT
1.0000 "application " | TOPICAL_OINTMENT | RECTAL | Status: DC | PRN
  Administered 2012-12-27: 1 via RECTAL
  Filled 2012-12-25: qty 28

## 2012-12-25 MED ORDER — SIMETHICONE 80 MG PO CHEW
80.0000 mg | CHEWABLE_TABLET | ORAL | Status: DC
  Administered 2012-12-26 – 2012-12-29 (×3): 80 mg via ORAL
  Filled 2012-12-25 (×4): qty 1

## 2012-12-25 MED ORDER — DIPHENHYDRAMINE HCL 25 MG PO CAPS: 25.0000 mg | ORAL_CAPSULE | Freq: Four times a day (QID) | ORAL | Status: DC | PRN

## 2012-12-25 MED ORDER — NALBUPHINE HCL 10 MG/ML IJ SOLN
5.0000 mg | INTRAMUSCULAR | Status: DC | PRN
  Administered 2012-12-25 – 2012-12-26 (×2): 5 mg via INTRAVENOUS
  Filled 2012-12-25: qty 1

## 2012-12-25 MED ORDER — LANOLIN HYDROUS EX OINT: 1.0000 "application " | TOPICAL_OINTMENT | CUTANEOUS | Status: DC | PRN

## 2012-12-25 MED ORDER — PRENATAL MULTIVITAMIN CH
1.0000 | ORAL_TABLET | Freq: Every day | ORAL | Status: DC
  Administered 2012-12-26 – 2012-12-29 (×4): 1 via ORAL
  Filled 2012-12-25 (×4): qty 1

## 2012-12-25 MED ORDER — FENTANYL CITRATE 0.05 MG/ML IJ SOLN
INTRAMUSCULAR | Status: AC
  Filled 2012-12-25: qty 2

## 2012-12-25 MED ORDER — MORPHINE SULFATE (PF) 0.5 MG/ML IJ SOLN
INTRAMUSCULAR | Status: DC | PRN
  Administered 2012-12-25: .2 mg via INTRATHECAL

## 2012-12-25 SURGICAL SUPPLY — 37 items
ADH SKN CLS APL DERMABOND .7 (GAUZE/BANDAGES/DRESSINGS) ×1
BLADE SURG CLIPPER 3M 9600 (MISCELLANEOUS) ×1 IMPLANT
CLAMP CORD UMBIL (MISCELLANEOUS) IMPLANT
CLOTH BEACON ORANGE TIMEOUT ST (SAFETY) ×2 IMPLANT
DERMABOND ADVANCED (GAUZE/BANDAGES/DRESSINGS) ×1
DERMABOND ADVANCED .7 DNX12 (GAUZE/BANDAGES/DRESSINGS) ×1 IMPLANT
DRAPE LG THREE QUARTER DISP (DRAPES) ×4 IMPLANT
DRSG OPSITE POSTOP 4X10 (GAUZE/BANDAGES/DRESSINGS) ×2 IMPLANT
DURAPREP 26ML APPLICATOR (WOUND CARE) ×2 IMPLANT
ELECT REM PT RETURN 9FT ADLT (ELECTROSURGICAL) ×2
ELECTRODE REM PT RTRN 9FT ADLT (ELECTROSURGICAL) ×1 IMPLANT
EXTRACTOR VACUUM M CUP 4 TUBE (SUCTIONS) IMPLANT
GLOVE BIO SURGEON STRL SZ 6.5 (GLOVE) ×2 IMPLANT
GLOVE BIOGEL PI IND STRL 7.0 (GLOVE) ×1 IMPLANT
GLOVE BIOGEL PI INDICATOR 7.0 (GLOVE) ×1
GOWN PREVENTION PLUS XLARGE (GOWN DISPOSABLE) ×4 IMPLANT
GOWN STRL REIN XL XLG (GOWN DISPOSABLE) ×4 IMPLANT
KIT ABG SYR 3ML LUER SLIP (SYRINGE) ×2 IMPLANT
NDL HYPO 25X5/8 SAFETYGLIDE (NEEDLE) ×1 IMPLANT
NEEDLE HYPO 25X5/8 SAFETYGLIDE (NEEDLE) ×2 IMPLANT
NS IRRIG 1000ML POUR BTL (IV SOLUTION) ×2 IMPLANT
PACK C SECTION WH (CUSTOM PROCEDURE TRAY) ×2 IMPLANT
PAD OB MATERNITY 4.3X12.25 (PERSONAL CARE ITEMS) ×2 IMPLANT
STAPLER VISISTAT 35W (STAPLE) IMPLANT
SUT CHROMIC 0 CT 802H (SUTURE) IMPLANT
SUT CHROMIC 0 CTX 36 (SUTURE) ×8 IMPLANT
SUT MON AB-0 CT1 36 (SUTURE) ×2 IMPLANT
SUT PDS AB 0 CTX 60 (SUTURE) ×2 IMPLANT
SUT PLAIN 0 NONE (SUTURE) IMPLANT
SUT VIC AB 2-0 SH 27 (SUTURE) ×2
SUT VIC AB 2-0 SH 27XBRD (SUTURE) IMPLANT
SUT VIC AB 3-0 SH 27 (SUTURE) ×2
SUT VIC AB 3-0 SH 27X BRD (SUTURE) IMPLANT
SUT VIC AB 4-0 KS 27 (SUTURE) ×1 IMPLANT
TOWEL OR 17X24 6PK STRL BLUE (TOWEL DISPOSABLE) ×2 IMPLANT
TRAY FOLEY CATH 14FR (SET/KITS/TRAYS/PACK) ×1 IMPLANT
WATER STERILE IRR 1000ML POUR (IV SOLUTION) ×2 IMPLANT

## 2012-12-25 NOTE — Transfer of Care (Signed)
Immediate Anesthesia Transfer of Care Note  Patient: Roberta Wood  Procedure(s) Performed: Procedure(s): Primary CESAREAN SECTION of baby boy  at 2007 APGAR 7/8 (N/A)  Patient Location: PACU  Anesthesia Type:Spinal  Level of Consciousness: awake  Airway & Oxygen Therapy: Patient Spontanous Breathing  Post-op Assessment: Report given to PACU RN and Post -op Vital signs reviewed and stable  Post vital signs: stable  Complications: No apparent anesthesia complications

## 2012-12-25 NOTE — OR Nursing (Signed)
75 ml blood loss during fundal massage by DLWegner RN, Cord blood x 2 to front desk

## 2012-12-25 NOTE — Progress Notes (Signed)
Pt c/o increase in contractions.  Notes increase intensity and freqency today.  No vb or pain between contractions.  + FM  FHT reassuring w/ accels Toco Q2-6 Cvx 3/90/-2  Bedside US done - infant transverse presentation w/ head on maternal left  A/P:  PPROM, transverse presentation, labor Plan for c-section R/b/a discussed, informed consent

## 2012-12-25 NOTE — Anesthesia Postprocedure Evaluation (Signed)
Anesthesia Post Note  Patient: Roberta Wood  Procedure(s) Performed: Procedure(s) (LRB): Primary CESAREAN SECTION of baby boy  at 2007 APGAR 7/8 (N/A)  Anesthesia type: Spinal  Patient location: PACU  Post pain: Pain level controlled  Post assessment: Post-op Vital signs reviewed  Last Vitals:  Filed Vitals:   12/25/12 2115  BP: 106/65  Pulse: 65  Temp:   Resp: 11    Post vital signs: Reviewed  Level of consciousness: awake  Complications: No apparent anesthesia complications

## 2012-12-25 NOTE — Progress Notes (Signed)
Pt reports episode of LLQ cramping this am that lasted about 10 min and resolved.  Overall, cramping decreased today compared to yesterday.   No further VB.  Good FM  AF, VSS + FHT Gen - NAD Abd - gravid, NT Ext - NT PV - deferred  A/P:  PPROM Continue current plan

## 2012-12-25 NOTE — Op Note (Signed)
Cesarean Section Procedure Note   AMIAYA MCNEELEY  11/13/2012 - 12/25/2012  Indications: PPROM, Transverse lie, labor   Pre-operative Diagnosis: breech, in labor, ruptured.   Post-operative Diagnosis: Same   Surgeon: Surgeon(s) and Role:    * Zelphia Cairo, MD - Primary   Assistants: none  Anesthesia: spinal   Procedure Details:  The patient was seen in the Holding Room. The risks, benefits, complications, treatment options, and expected outcomes were discussed with the patient. The patient concurred with the proposed plan, giving informed consent. identified as [name] and the procedure verified as C-Section Delivery. A Time Out was held and the above information confirmed.  After induction of anesthesia, the patient was draped and prepped in the usual sterile manner. A transverse was made and carried down through the subcutaneous tissue to the fascia. Fascial incision was made and extended transversely. The fascia was separated from the underlying rectus tissue superiorly and inferiorly. The peritoneum was identified and entered. Peritoneal incision was extended longitudinally. The utero-vesical peritoneal reflection was incised transversely and the bladder flap was bluntly freed from the lower uterine segment. A low transverse uterine incision was made. Delivered from transverse presentation was a 1550 gram Living newborn infant(s) with Apgar scores of pending NICU team at one minute and pending nicu team at five minutes. Cord ph was not sent the umbilical cord was clamped and cut cord blood was obtained for evaluation. The placenta was removed Intact and appeared normal. The uterine outline, tubes and ovaries appeared normal}. The uterine incision was closed with running locked sutures of 0chromic gut.   Hemostasis was observed. Lavage was carried out until clear.  Upon returning the uterus to the pelvis, a small serosal laceration was noted on the posterior wall of the  uterus.  This was repaired with 0 chromic.  Peritoneum was closed with 0 monocryl.   The fascia was then reapproximated with running sutures of 0PDS.  The skin was closed with 4-0Vicryl.   Instrument, sponge, and needle counts were correct prior the abdominal closure and were correct at the conclusion of the case.     Estimated Blood Loss: 600cc  Urine Output: clear  Specimens: @ORSPECIMEN @   Complications: no complications  Disposition: PACU - hemodynamically stable.   Maternal Condition: stable   Baby condition / location:  NICU  Attending Attestation: I was present and scrubbed for the entire procedure.   Signed: Surgeon(s): Zelphia Cairo, MD

## 2012-12-25 NOTE — Anesthesia Preprocedure Evaluation (Signed)
Anesthesia Evaluation  Patient identified by MRN, date of birth, ID band Patient awake    Reviewed: Allergy & Precautions, H&P , NPO status , Patient's Chart, lab work & pertinent test results, reviewed documented beta blocker date and time   History of Anesthesia Complications Negative for: history of anesthetic complications  Airway Mallampati: I TM Distance: >3 FB Neck ROM: full    Dental  (+) Teeth Intact   Pulmonary neg pulmonary ROS,  breath sounds clear to auscultation        Cardiovascular negative cardio ROS  Rhythm:regular Rate:Normal     Neuro/Psych  Headaches, negative psych ROS   GI/Hepatic negative GI ROS, Neg liver ROS,   Endo/Other  negative endocrine ROS  Renal/GU negative Renal ROS     Musculoskeletal   Abdominal   Peds  Hematology  (+) anemia ,   Anesthesia Other Findings Last full meal 2 pm, ate chocolate at 3:45 pm Sips until case was posted  Reproductive/Obstetrics (+) Pregnancy (PPROM, transverse lie, bedrest x 6 weeks - now laboring (32 weeks))                           Anesthesia Physical Anesthesia Plan  ASA: II and emergent  Anesthesia Plan: Spinal   Post-op Pain Management:    Induction:   Airway Management Planned:   Additional Equipment:   Intra-op Plan:   Post-operative Plan:   Informed Consent: I have reviewed the patients History and Physical, chart, labs and discussed the procedure including the risks, benefits and alternatives for the proposed anesthesia with the patient or authorized representative who has indicated his/her understanding and acceptance.     Plan Discussed with: Surgeon and CRNA  Anesthesia Plan Comments:         Anesthesia Quick Evaluation

## 2012-12-25 NOTE — Anesthesia Procedure Notes (Signed)
Spinal  Patient location during procedure: OR Start time: 12/25/2012 7:40 PM End time: 12/25/2012 7:44 PM Reason for block: procedure for pain Staffing Anesthesiologist: Sandrea Hughs Performed by: anesthesiologist  Preanesthetic Checklist Completed: patient identified, surgical consent, pre-op evaluation, timeout performed, IV checked, risks and benefits discussed and monitors and equipment checked Spinal Block Patient position: sitting Prep: DuraPrep Patient monitoring: heart rate, cardiac monitor, continuous pulse ox and blood pressure Approach: midline Location: L3-4 Injection technique: single-shot Needle Needle type: Sprotte  Needle gauge: 24 G Needle length: 9 cm Needle insertion depth: 5 cm Assessment Sensory level: T6

## 2012-12-26 LAB — CBC
HCT: 29.4 % — ABNORMAL LOW (ref 36.0–46.0)
Hemoglobin: 10.3 g/dL — ABNORMAL LOW (ref 12.0–15.0)
MCH: 31.7 pg (ref 26.0–34.0)
MCHC: 35 g/dL (ref 30.0–36.0)
MCV: 90.5 fL (ref 78.0–100.0)
RDW: 12.8 % (ref 11.5–15.5)

## 2012-12-26 NOTE — Anesthesia Postprocedure Evaluation (Signed)
Anesthesia Post Note  Patient: Roberta Wood  Procedure(s) Performed: Procedure(s) (LRB): Primary CESAREAN SECTION of baby boy  at 2007 APGAR 7/8 (N/A)  Anesthesia type: Spinal  Patient location: Mother/Baby  Post pain: Pain level controlled  Post assessment: Post-op Vital signs reviewed  Last Vitals:  Filed Vitals:   12/26/12 0400  BP: 106/70  Pulse: 78  Temp: 36.7 C  Resp: 18    Post vital signs: Reviewed  Level of consciousness: awake  Complications: No apparent anesthesia complications

## 2012-12-26 NOTE — Plan of Care (Signed)
Problem: Phase I Progression Outcomes Goal: IS, TCDB as ordered Outcome: Completed/Met Date Met:  12/26/12 Can get I/S up to 2250

## 2012-12-26 NOTE — Progress Notes (Signed)
Post Partum Day 1 Subjective: no complaints, up ad lib and tolerating PO.  Infant in NICU doing well  Objective: Blood pressure 106/70, pulse 78, temperature 98.1 F (36.7 C), temperature source Oral, resp. rate 18, height 5\' 5"  (1.651 m), weight 57.879 kg (127 lb 9.6 oz), last menstrual period 05/11/2012, SpO2 97.00%, unknown if currently breastfeeding.  Physical Exam:  General: alert and cooperative Lochia: appropriate Uterine Fundus: firm Incision: no significant drainage DVT Evaluation: No evidence of DVT seen on physical exam.   Recent Labs  12/25/12 1845 12/26/12 0507  HGB 11.7* 10.3*  HCT 33.7* 29.4*    Assessment/Plan: Continue current care. Pumping, infant in NICU   LOS: 43 days   Atarah Cadogan 12/26/2012, 9:17 AM

## 2012-12-26 NOTE — Plan of Care (Signed)
Problem: Phase I Progression Outcomes Goal: Initial discharge plan identified Outcome: Completed/Met Date Met:  12/26/12 Pain Controlled VSS Bleeding WNL Understands self care Understands when to call the MD

## 2012-12-27 ENCOUNTER — Encounter (HOSPITAL_COMMUNITY): Payer: Self-pay | Admitting: Obstetrics and Gynecology

## 2012-12-27 MED ORDER — HYDROCODONE-ACETAMINOPHEN 5-325 MG PO TABS
1.5000 | ORAL_TABLET | Freq: Four times a day (QID) | ORAL | Status: DC | PRN
  Administered 2012-12-27 – 2012-12-28 (×3): 1.5 via ORAL
  Filled 2012-12-27 (×3): qty 2

## 2012-12-27 MED ORDER — POLYETHYLENE GLYCOL 3350 17 G PO PACK
17.0000 g | PACK | Freq: Every day | ORAL | Status: DC | PRN
  Administered 2012-12-27: 17 g via ORAL
  Filled 2012-12-27: qty 1

## 2012-12-27 NOTE — Progress Notes (Signed)
CSW attempted to meet with MOB in her room, but she was not available at this time. 

## 2012-12-27 NOTE — Progress Notes (Signed)
Subjective: Postpartum Day 2: Cesarean Delivery Patient reports incisional pain, tolerating PO and no problems voiding.  Baby in NICU and off CPAP.  Objective: Vital signs in last 24 hours: Temp:  [97.9 F (36.6 C)-98.4 F (36.9 C)] 97.9 F (36.6 C) (10/13 0557) Pulse Rate:  [74-81] 81 (10/13 0557) Resp:  [18-19] 19 (10/13 0557) BP: (90-99)/(45-67) 99/67 mmHg (10/13 0557) SpO2:  [98 %-100 %] 98 % (10/13 0557)  Physical Exam:  General: alert, cooperative and appears stated age Lochia: appropriate Uterine Fundus: firm Incision: no significant drainage, Honeycomb dressing in place DVT Evaluation: No evidence of DVT seen on physical exam. Negative Homan's sign. No cords or calf tenderness.   Recent Labs  12/25/12 1845 12/26/12 0507  HGB 11.7* 10.3*  HCT 33.7* 29.4*    Assessment/Plan: Status post Cesarean section. Doing well postoperatively.  Continue current care.  Ramie Palladino 12/27/2012, 7:54 AM

## 2012-12-27 NOTE — Progress Notes (Signed)
Ur chart review completed.  

## 2012-12-27 NOTE — Lactation Note (Signed)
This note was copied from the chart of Roberta Rane Dumm. Lactation Consultation Note    Follow up consult with this mom of a NICU baby, now 44 hours post partum. Mom has been pumping intermittently, due to exhaustion, although she has been pumping every 3 hours today. I encouraged her to pump as close to 8 times a day as she can, and explained to her the importance of the first two weeks. She has expressed drops of colostrum. I showed mom how to had express, and was able to collect a large drops (0.3 mls?) of colostrum. I had mom apply EBM to her areolas - they both were scabbed from her initial pumping session -I decreased mom to 21 flanges today, which are a better fit. Mom has a personal DEP that she received from her insurance company. I was not familiar with it - mom says it was recommended by the [facility]. I will help mom and dad with figuring out how it works tomorrow.   Patient Name: Roberta Wood RUEAV'W Date: 12/27/2012 Reason for consult: Follow-up assessment;NICU baby   Maternal Data Formula Feeding for Exclusion: Yes (baby in NICU) Infant to breast within first hour of birth: No Breastfeeding delayed due to:: Infant status Has patient been taught Hand Expression?: Yes Does the patient have breastfeeding experience prior to this delivery?: No  Feeding Feeding Type: Formula Length of feed: 45 min  LATCH Score/Interventions          Comfort (Breast/Nipple): Filling, red/small blisters or bruises, mild/mod discomfort (areolas with large dried scabs from pumping off center)  Problem noted: Mild/Moderate discomfort Interventions  (Cracked/bleeding/bruising/blister): Expressed breast milk to nipple        Lactation Tools Discussed/Used Tools: Pump Breast pump type: Double-Electric Breast Pump WIC Program: No (mom has DEP from insurance ) Pump Review: Setup, frequency, and cleaning;Milk Storage;Other (comment) (hand expression, decreased to 21  flanges) Initiated by:: bedside Rn within 6 hours fo delivery Date initiated:: 12/26/12   Consult Status Consult Status: Follow-up Date: 12/28/12 Follow-up type: In-patient    Alfred Levins 12/27/2012, 5:09 PM

## 2012-12-27 NOTE — Progress Notes (Signed)
Patient feels she is not getting adequate pain relief with the percocet. Notified MD, new orders placed. Will cont to assess .

## 2012-12-28 ENCOUNTER — Encounter (HOSPITAL_COMMUNITY)
Admission: RE | Admit: 2012-12-28 | Discharge: 2012-12-28 | Disposition: A | Discharge status: Home / Self Care | Payer: 59 | Source: Ambulatory Visit | Attending: Obstetrics and Gynecology | Admitting: Obstetrics and Gynecology

## 2012-12-28 DIAGNOSIS — O923 Agalactia: Secondary | ICD-10-CM | POA: Insufficient documentation

## 2012-12-28 MED ORDER — HYDROMORPHONE HCL 2 MG PO TABS
2.0000 mg | ORAL_TABLET | ORAL | Status: DC | PRN
  Administered 2012-12-28 – 2012-12-29 (×5): 2 mg via ORAL
  Filled 2012-12-28 (×5): qty 1

## 2012-12-28 NOTE — Lactation Note (Signed)
This note was copied from the chart of Roberta Wood. Lactation Consultation Note     Follow up consult with this mom of a NICU baby, 33 weeks corrected gestation today, and now 62 hours post partum. Mom's milk is beginning to transition in. I reviewed hadn expression with mom - her breasts are very tender. Her areolas are still scabbed. Mom has been using lanolin. I advised her to use EBM and gave her comfort gerls to try, and instructed her in their use. i also gave mom the paper work for a DEP rental. She nees to order smaller falnges for her [place] DEP. Mom knows to call for questions/concerns  Patient Name: Roberta Wood [id number] Date: 12/28/2012 Reason for consult: Follow-up assessment;NICU baby   Maternal Data    Feeding Feeding Type: Formula Length of feed: 45 min  LATCH Score/Interventions                      Lactation Tools Discussed/Used     Consult Status Consult Status: Follow-up Date: 12/29/12 Follow-up type: In-patient    Alfred Levins 12/28/2012, 10:21 AM

## 2012-12-28 NOTE — Progress Notes (Signed)
12/28/12 1500  Clinical Encounter Type  Visited With Patient and family together (husband Roberta Wood)  Visit Type Spiritual support;Social support  Spiritual Encounters  Spiritual Needs Emotional  Stress Factors  Patient Stress Factors (struggling with pain mgmt)   Followed up post delivery with Roberta Wood and and husband Roberta Wood, providing opportunity for couple to process their delivery/recovery experience and adjustment to parenting a baby in the NICU.  They are relieved and grateful for the big picture--that baby Roberta Wood is doing well--but Roberta Wood is also struggling with a series of frustrations and disappointments (trouble with managing her pain, missing opportunities to spend time with Roberta Wood because of her discomfort/tiredness, accidentally spilling expressed milk, etc).  I provided pastoral listening and reflection, affirmation, and encouragement.  She and Roberta Wood also shared some of their vocational discernment and future hopes for their family.  They were very appreciative of spiritual companionship and having a conversation partner, and they plan to reach out of they would like help in contacting their priest for an anointing of the sick prior to [facility] discharge.  Spiritual Care will continue to follow family in the NICU.  [address] [place], [place] [phone]

## 2012-12-28 NOTE — Progress Notes (Signed)
Subjective: Postpartum Day 3: Cesarean Delivery Patient reports incisional pain, tolerating PO, + flatus, + BM and no problems voiding.  Baby stable in NICU. Continues to report incisional pain.  Objective: Vital signs in last 24 hours: Temp:  [97.5 F (36.4 C)-98.4 F (36.9 C)] 97.8 F (36.6 C) (10/14 0605) Pulse Rate:  [84-91] 84 (10/14 0605) Resp:  [18] 18 (10/14 0605) BP: (94-102)/(61-68) 96/63 mmHg (10/14 0605) SpO2:  [97 %-100 %] 97 % (10/14 0605)  Physical Exam:  General: alert and cooperative Lochia: appropriate Uterine Fundus: firm Incision: healing well, honeycomb dressing CDI DVT Evaluation: No evidence of DVT seen on physical exam. Negative Homan's sign. No cords or calf tenderness. No significant calf/ankle edema.   Recent Labs  12/25/12 1845 12/26/12 0507  HGB 11.7* 10.3*  HCT 33.7* 29.4*    Assessment/Plan: Status post Cesarean section. Doing well postoperatively.  Change pain meds to Dilaudid.  Raydon Chappuis G 12/28/2012, 8:17 AM

## 2012-12-29 ENCOUNTER — Ambulatory Visit: Payer: Self-pay

## 2012-12-29 DIAGNOSIS — Z98891 History of uterine scar from previous surgery: Secondary | ICD-10-CM

## 2012-12-29 DIAGNOSIS — O4290 Premature rupture of membranes, unspecified as to length of time between rupture and onset of labor, unspecified weeks of gestation: Principal | ICD-10-CM | POA: Diagnosis present

## 2012-12-29 MED ORDER — HYDROMORPHONE HCL 2 MG PO TABS: 2.0000 mg | ORAL_TABLET | ORAL | Status: DC | PRN

## 2012-12-29 NOTE — Progress Notes (Signed)
Patient education was complete. Patient had questions about finances and insurance and was given the number for the financial counselor.Patient had questions about postpartum depression. The social worker was notified and the patient was given Colleen's number for a follow up visit. Patient was walked out with the tech along with husband.

## 2012-12-29 NOTE — Progress Notes (Signed)
Subjective: Postpartum Day four: Cesarean Delivery Patient reports tolerating PO, + BM and no problems voiding.    Objective: Vital signs in last 24 hours: Temp:  [97.5 F (36.4 C)-98.3 F (36.8 C)] 98.3 F (36.8 C) (10/15 0548) Pulse Rate:  [76-90] 76 (10/15 0548) Resp:  [16-18] 16 (10/15 0548) BP: (97-107)/(63-72) 102/69 mmHg (10/15 0548) SpO2:  [94 %-98 %] 98 % (10/15 0548)  Physical Exam:  General: alert Lochia: appropriate Uterine Fundus: firm Incision: healing well DVT Evaluation: No evidence of DVT seen on physical exam.  No results found for this basename: HGB, HCT,  in the last 72 hours  Assessment/Plan: Status post Cesarean section. Doing well postoperatively.  Discharge home with standard precautions and return to clinic in 4-6 weeks.  Ancel Easler S 12/29/2012, 8:14 AM

## 2012-12-29 NOTE — Lactation Note (Signed)
This note was copied from the chart of Roberta Ludella Pranger. Lactation Consultation Note    Mom being discharged to home today, and is 86 hours post partum. She reports sh is transitioning into mature milk, getting just enough to drip into the bottle. She rented a DEP, and was instructed in it's use. I reviewed discharge pumping teaching with mom, and will follow this family in the NICU.  Patient Name: Roberta Wood [id number] Date: 12/29/2012     Maternal Data    Feeding Feeding Type: Formula Length of feed: 45 min  LATCH Score/Interventions                      Lactation Tools Discussed/Used     Consult Status      Alfred Levins 12/29/2012, 6:57 PM

## 2012-12-29 NOTE — Discharge Summary (Signed)
  Patient name  Roberta Wood, Roberta Wood DICTATION#  [id number] CSN# [id number]  [facility], MD 12/29/2012 8:17 AM

## 2012-12-30 NOTE — Discharge Summary (Signed)
Roberta Wood, Roberta Wood              ACCOUNT NO.:  [id number]  MEDICAL RECORD NO.:  [id number]  LOCATION:                                 FACILITY:  PHYSICIAN:  Juluis Mire, M.D.        DATE OF BIRTH:  DATE OF ADMISSION:  11/13/2012 DATE OF DISCHARGE:  12/29/2012                              DISCHARGE SUMMARY   ADMITTING DIAGNOSIS:  Intrauterine pregnancy at 42 and 4/7th weeks with premature rupture of membranes.  DISCHARGE DIAGNOSIS:  Intrauterine pregnancy at 67 and 4/7th weeks with premature rupture of membranes with the addition of a breech to transverse lie.  OPERATIVE PROCEDURE DURING HOSPITALIZATION:  Low transverse cesarean section.  For complete history and physical, please see dictated note.  COURSE IN THE HOSPITAL:  The patient was admitted and begun on expectant management.  She did receive betamethasone.  Maternal Fetal Medicine consult was also obtained.  It was noted that the infant was in a breech presentation.  The plan was when she admitted to labor to proceed with cesarean section or deliver her by 34 weeks.  She never really showed any signs of infection.  There were no signs of any fetal distress during her hospitalization.  Subsequently on December 25, 2012, she went into labor, was found to be in a transverse lie and underwent a low- transverse cesarean section.  The infant was a viable female.  Apgars were 7/8.  Baby weighed 3 pounds and 6.7 ounces and was doing well on the nursery at this time.  This is the patient's fourth postoperative day. She is going to be discharged home at this point in time.  Her postop hemoglobin was 10.3.  At the time of discharge, she was afebrile with stable vital signs.  Abdomen was soft.  Bowel sounds were active.  She had a bowel movement.  Incision was clear.  She was voiding without difficulty.  Bleeding was minimal.  The patient was breast feeding.  In terms of complications, there is none encountered during the stay  in the hospital.  The patient discharged home in stable condition.  DISPOSITION:  The patient is instructed to call with signs of fever, nausea, vomiting, excessive pain, or heavy vaginal bleeding.  She should watch the incision for any redness or drainage and will follow up in the office in 1 week.  She is to avoid heavy lifting, vaginal entry, or driving a car.  Discharged home with Dilaudid as needed for pain.  We will continue her prenatal vitamins.     Juluis Mire, M.D.     JSM/MEDQ  D:  12/29/2012  T:  12/29/2012  Job:  [id number]

## 2013-01-04 ENCOUNTER — Ambulatory Visit: Payer: Self-pay

## 2013-01-04 NOTE — Lactation Note (Signed)
This note was copied from the chart of Roberta Adamaris King. Lactation Consultation Note    Follow up consulst with this mom of a NICU baby, now 34 weeks corrected gestation at 81 days old, weighing 3 lb 7 oz.    Mom is concerned that she is not expressing enough milk. I estimated from what she told me that she is expressing right at the low end of normal - just under 500 mls a day at DOL 10. Mom admits to not staying hydrated, and not getting enough rest. I advised her to do her best to rectify both t- of those.  I also gave mom information on moringa and fenugreek, power pumping, and encouraged her to continue skin to skin, keep her pumping log.  This mom has been on antidepressants in the past. She denies being depressed, but admits to being exhausted. She admits to still trying to accomplish lots, while trying to pump every 2-3 hours. She smiled when i told her she has "permission" to nap every day, and let some less important things go. i also suggested she talk to her OB about antidepressants. I will follow this mom in the NICU  Patient Name: Roberta Wood [id number] Date: 01/04/2013 Reason for consult: Follow-up assessment;NICU baby   Maternal Data    Feeding Feeding Type: Formula Length of feed: 60 min  LATCH Score/Interventions                      Lactation Tools Discussed/Used     Consult Status Consult Status: PRN Follow-up type:  (in NICU)    Roberta Wood 01/04/2013, 1:51 PM

## 2013-01-05 ENCOUNTER — Encounter (HOSPITAL_COMMUNITY)
Admission: AD | Disposition: A | Discharge status: Home / Self Care | Payer: Self-pay | Source: Ambulatory Visit | Attending: Obstetrics and Gynecology

## 2013-01-05 SURGERY — Surgical Case
Anesthesia: Regional

## 2013-01-12 ENCOUNTER — Ambulatory Visit: Payer: Self-pay

## 2013-01-12 NOTE — Lactation Note (Signed)
This note was copied from the chart of Roberta Dempsey Knotek. Lactation Consultation Note Phone consultation: mom called with questions regarding low supply and painful nipples. Per phone conversation, mom pumping 100 mL per day, pumping 7 times per day, once at night, states nipples are sore and might be rubbing inside the flange. Discussed strategies for milk supply. Inst mom to continue fenugreek which she started on Saturday, to continue frequent pumping and power pumping, to get plenty of rest and fluids. Inst mom to bring her pump supplies into the NICU tomorrow for an assessment by lactation consultant for sore nipples and flange size.  Patient Name: Roberta Wood [id number] Date: 01/12/2013     Maternal Data    Feeding Feeding Type: Formula Nipple Type: Slow - flow Length of feed: 15 min  LATCH Score/Interventions                      Lactation Tools Discussed/Used     Consult Status      [name] 01/12/2013, 3:08 PM

## 2013-01-18 ENCOUNTER — Ambulatory Visit: Payer: Self-pay

## 2013-01-18 NOTE — Lactation Note (Signed)
This note was copied from the chart of Roberta Wood. Lactation Consultation Note     Follow up consult with this mom and baby, now 36 weeks corrected gestation, and 40 weeks old. Mom reports her OB, Dr, Arelia Sneddon, started her on reglan due to low milk supply. Mom has  Admitted immediately after giving birth,  to being somewhat depressed since the baby was born, so I cautioned her to call Dr. Arelia Sneddon if she feels an increase in depression. I assisted mom with latching the baby with a 16 nipple shield. Mom has a low milk supply, and the baby would not maintain a strong sucking pattern. I used his bottle of thickened formula with an SNS under the shield. He was much more actively sucking , with audible swallows. I needed to apply gently pressure with the SNS, due to how thick the formula  was. He transferred 15 mls at the breast , of formula, and then continued feeding from the bottle. I gave mom a permanent SNS to take home and sterilize, so we could try tomorrow with a wider SNS tubing. Mom was pleased with the SNS, and was eager to try again tommrow  Patient Name: Roberta Wood [id number] Date: 01/18/2013 Reason for consult: Follow-up assessment;NICU baby   Maternal Data    Feeding Feeding Type: Bottle Fed - Formula Nipple Type: Slow - flow Length of feed: 20 min  LATCH Score/Interventions Latch: Repeated attempts needed to sustain latch, nipple held in mouth throughout feeding, stimulation needed to elicit sucking reflex. Intervention(s): Adjust position;Assist with latch  Audible Swallowing: Spontaneous and intermittent (w SNS )  Type of Nipple: Everted at rest and after stimulation  Comfort (Breast/Nipple): Soft / non-tender     Hold (Positioning): Assistance needed to correctly position infant at breast and maintain latch. Intervention(s): Breastfeeding basics reviewed;Support Pillows;Position options  LATCH Score: 8  Lactation Tools Discussed/Used Tools: Nipple  Shields Nipple shield size: 16   Consult Status Consult Status: Follow-up Date: 01/19/13 Follow-up type: In-patient    Roberta Wood 01/18/2013, 4:43 PM

## 2013-01-28 ENCOUNTER — Encounter (HOSPITAL_COMMUNITY)
Admission: RE | Admit: 2013-01-28 | Discharge: 2013-01-28 | Disposition: A | Discharge status: Home / Self Care | Payer: 59 | Source: Ambulatory Visit | Attending: Obstetrics and Gynecology | Admitting: Obstetrics and Gynecology

## 2013-01-28 DIAGNOSIS — O923 Agalactia: Secondary | ICD-10-CM | POA: Insufficient documentation

## 2014-01-16 ENCOUNTER — Encounter (HOSPITAL_COMMUNITY): Payer: Self-pay | Admitting: Obstetrics and Gynecology

## 2014-03-29 ENCOUNTER — Other Ambulatory Visit: Payer: Self-pay | Admitting: Obstetrics and Gynecology

## 2014-03-30 LAB — CYTOLOGY - PAP

## 2014-05-03 ENCOUNTER — Other Ambulatory Visit: Payer: Self-pay | Admitting: Obstetrics and Gynecology

## 2014-05-03 DIAGNOSIS — R928 Other abnormal and inconclusive findings on diagnostic imaging of breast: Secondary | ICD-10-CM

## 2014-05-10 ENCOUNTER — Other Ambulatory Visit: Payer: Self-pay

## 2014-05-17 ENCOUNTER — Ambulatory Visit
Admission: RE | Admit: 2014-05-17 | Discharge: 2014-05-17 | Disposition: A | Discharge status: Home / Self Care | Payer: BLUE CROSS/BLUE SHIELD | Source: Ambulatory Visit | Attending: Obstetrics and Gynecology | Admitting: Obstetrics and Gynecology

## 2014-05-17 DIAGNOSIS — R928 Other abnormal and inconclusive findings on diagnostic imaging of breast: Secondary | ICD-10-CM

## 2014-11-29 IMAGING — US US OB LIMITED
1 series · 10 of 10 positions shown · non-contrast
Comparison: none

[Series 1: us ob limited · 0.23mm/px · 10 of 10 slices shown]
[im 1/10]
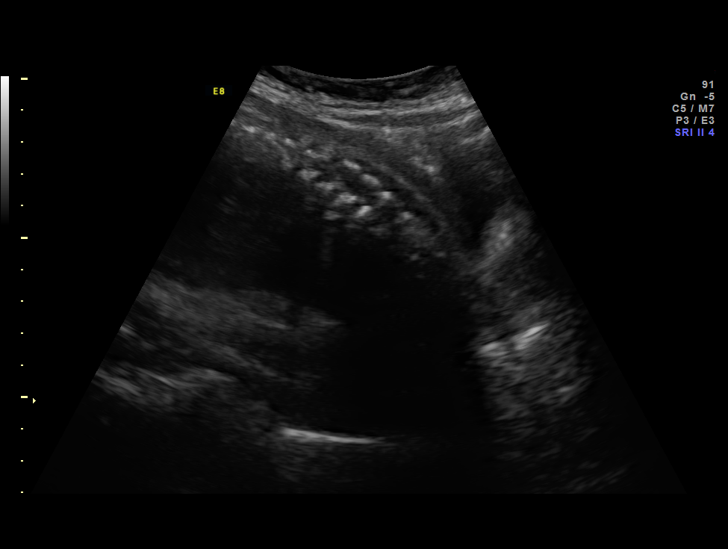
[im 2/10]
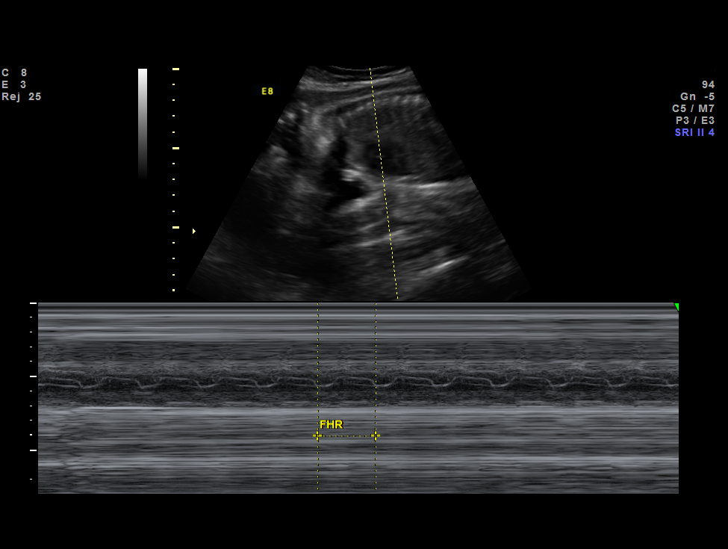
[im 3/10]
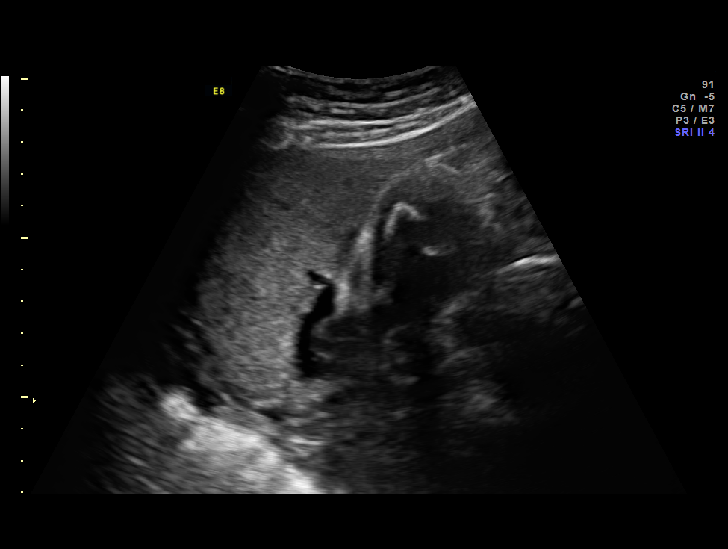
[im 4/10]
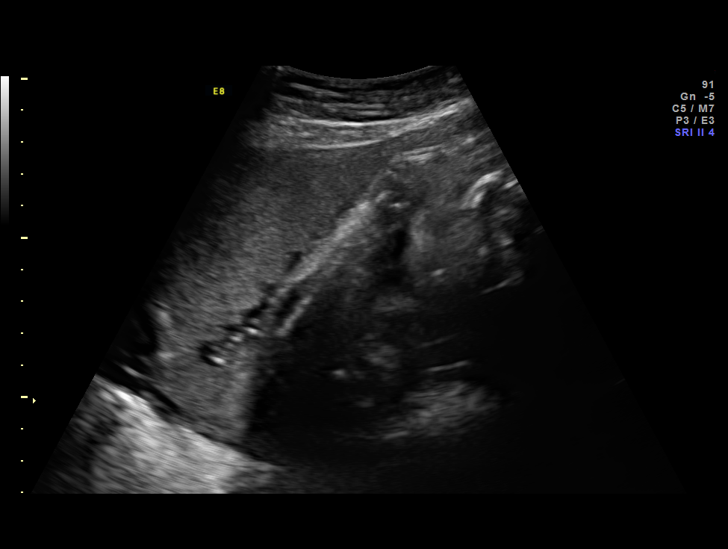
[im 5/10]
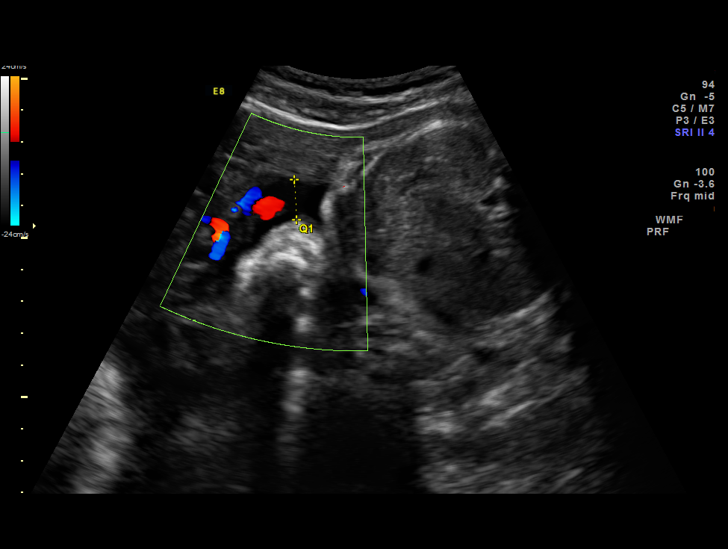
[im 6/10]
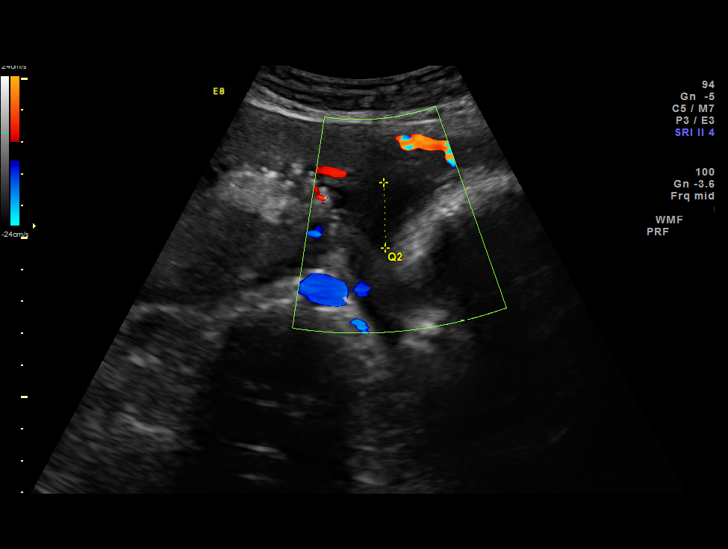
[im 7/10]
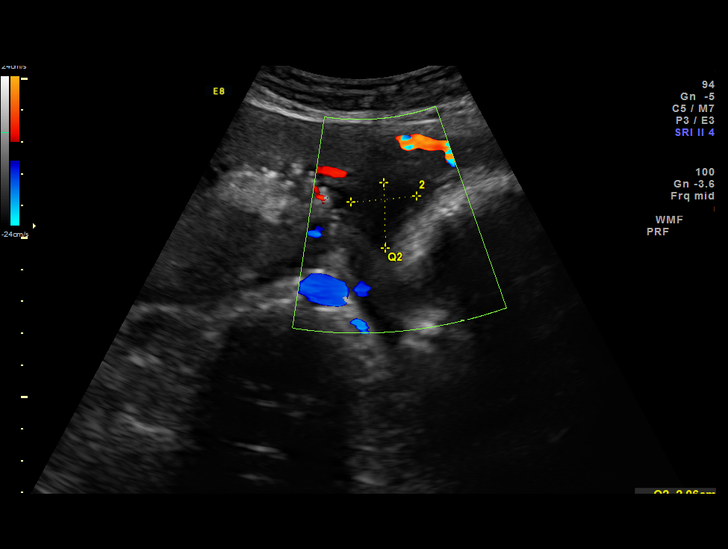
[im 8/10]
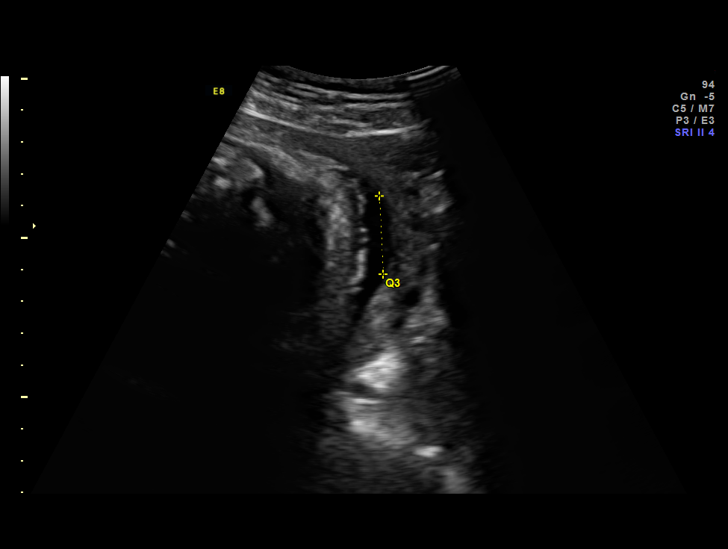
[im 9/10]
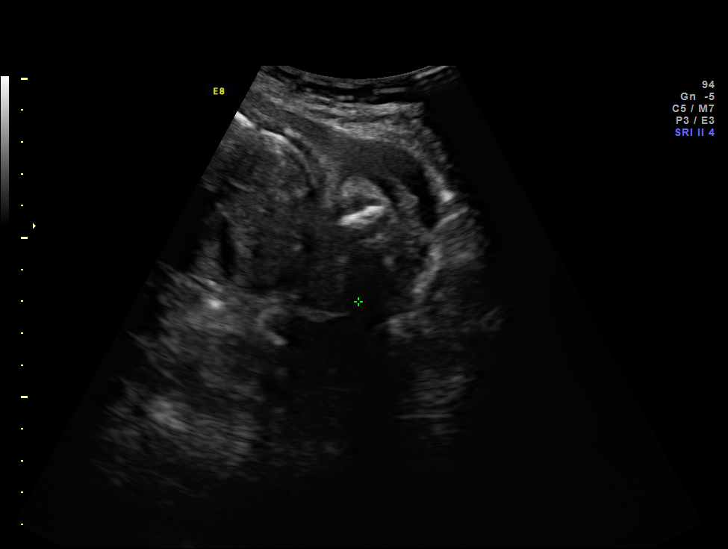
[im 10/10]
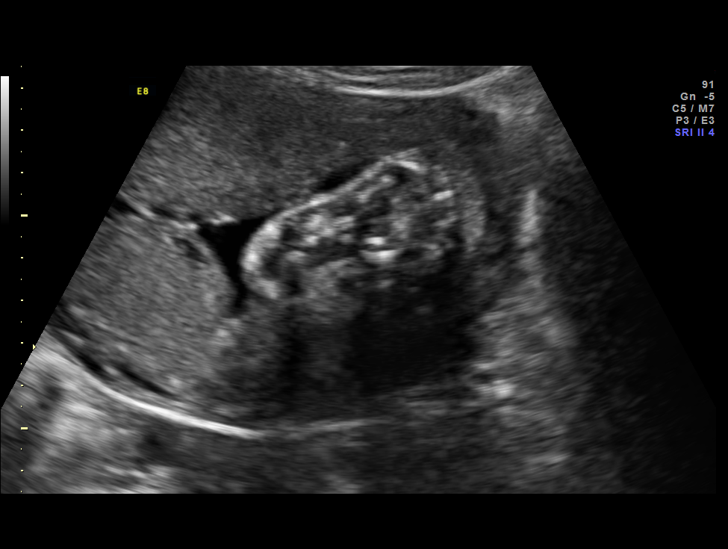

[10 of 10 positions shown; findings below may reference images not displayed]

OBSTETRICS REPORT
                      (Signed Final 12/15/2012 [DATE])

Service(s) Provided

 [HOSPITAL]                                         76815.0
Indications

 Premature rupture of membranes - leaking fluid
 2 vessel umbilical cord
 History of genetic / anatomic abnormality - cleft
 palate (pt's cousin)
Fetal Evaluation

 Num Of Fetuses:    1
 Fetal Heart Rate:  173                          bpm
 Cardiac Activity:  Observed
 Presentation:      Frank breech
 Placenta:          Anterior, above cervical os
 P. Cord            Previously Visualized
 Insertion:

 Amniotic Fluid
 AFI FV:      Subjectively low-normal
 AFI Sum:     5.78    cm      < 3  %Tile     Larg Pckt:    2.46  cm
 RUQ:   1.26    cm   LUQ:    2.06   cm    LLQ:   2.46    cm
Gestational Age

 LMP:           31w 1d        Date:  05/11/12                 EDD:   02/15/13
 Best:          31w 1d     Det. By:  LMP  (05/11/12)          EDD:   02/15/13
Cervix Uterus Adnexa

 Cervix:       Not visualized (advanced GA >23wks)
Impression

 Single IUP at 31 [DATE] weeks
 PPROM
 Limited ultrasound performed for amniotic fluid assessment
 Frank Breech presentation
 Amniotic fluid volume subjectively low (AFI 5.78 cm)
Recommendations

 Recommend follow-up ultrasound examination in 2 weeks for
 growth

## 2015-07-27 ENCOUNTER — Inpatient Hospital Stay (HOSPITAL_COMMUNITY)
Admission: AD | Admit: 2015-07-27 | Discharge: 2015-08-01 | DRG: 766 | Disposition: A | Discharge status: Home / Self Care | Payer: BLUE CROSS/BLUE SHIELD | Source: Ambulatory Visit | Attending: Obstetrics and Gynecology | Admitting: Obstetrics and Gynecology

## 2015-07-27 ENCOUNTER — Encounter (HOSPITAL_COMMUNITY): Payer: Self-pay

## 2015-07-27 ENCOUNTER — Ambulatory Visit (HOSPITAL_COMMUNITY)
Admit: 2015-07-27 | Discharge: 2015-07-27 | Disposition: A | Discharge status: Home / Self Care | Payer: BLUE CROSS/BLUE SHIELD | Attending: Obstetrics and Gynecology | Admitting: Obstetrics and Gynecology

## 2015-07-27 ENCOUNTER — Inpatient Hospital Stay (HOSPITAL_COMMUNITY): Payer: BLUE CROSS/BLUE SHIELD

## 2015-07-27 DIAGNOSIS — Z3A37 37 weeks gestation of pregnancy: Secondary | ICD-10-CM | POA: Diagnosis not present

## 2015-07-27 DIAGNOSIS — O4290 Premature rupture of membranes, unspecified as to length of time between rupture and onset of labor, unspecified weeks of gestation: Secondary | ICD-10-CM

## 2015-07-27 DIAGNOSIS — O322XX0 Maternal care for transverse and oblique lie, not applicable or unspecified: Secondary | ICD-10-CM | POA: Diagnosis present

## 2015-07-27 DIAGNOSIS — Z8249 Family history of ischemic heart disease and other diseases of the circulatory system: Secondary | ICD-10-CM

## 2015-07-27 DIAGNOSIS — O09219 Supervision of pregnancy with history of pre-term labor, unspecified trimester: Secondary | ICD-10-CM

## 2015-07-27 DIAGNOSIS — O34211 Maternal care for low transverse scar from previous cesarean delivery: Secondary | ICD-10-CM | POA: Diagnosis present

## 2015-07-27 LAB — TYPE AND SCREEN
ABO/RH(D): O POS
Antibody Screen: NEGATIVE

## 2015-07-27 LAB — FETAL FIBRONECTIN: Fetal Fibronectin: NEGATIVE

## 2015-07-27 LAB — WET PREP, GENITAL
CLUE CELLS WET PREP: NONE SEEN
Sperm: NONE SEEN
TRICH WET PREP: NONE SEEN

## 2015-07-27 MED ORDER — LACTATED RINGERS IV BOLUS (SEPSIS)
1000.0000 mL | Freq: Once | INTRAVENOUS | Status: AC
  Administered 2015-07-27: 1000 mL via INTRAVENOUS

## 2015-07-27 MED ORDER — ZOLPIDEM TARTRATE 5 MG PO TABS: 5.0000 mg | ORAL_TABLET | Freq: Every evening | ORAL | Status: DC | PRN

## 2015-07-27 MED ORDER — BETAMETHASONE SOD PHOS & ACET 6 (3-3) MG/ML IJ SUSP: 12.0000 mg | Freq: Once | INTRAMUSCULAR | Status: DC

## 2015-07-27 MED ORDER — NIFEDIPINE 10 MG PO CAPS
10.0000 mg | ORAL_CAPSULE | ORAL | Status: AC
Start: 2015-07-27 — End: 2015-07-27
  Administered 2015-07-27 (×2): 10 mg via ORAL
  Filled 2015-07-27 (×3): qty 1

## 2015-07-27 MED ORDER — BETAMETHASONE SOD PHOS & ACET 6 (3-3) MG/ML IJ SUSP
12.0000 mg | INTRAMUSCULAR | Status: AC
  Administered 2015-07-27 – 2015-07-28 (×2): 12 mg via INTRAMUSCULAR
  Filled 2015-07-27 (×2): qty 2

## 2015-07-27 MED ORDER — DOCUSATE SODIUM 100 MG PO CAPS
100.0000 mg | ORAL_CAPSULE | Freq: Every day | ORAL | Status: DC
  Administered 2015-07-27 – 2015-07-29 (×3): 100 mg via ORAL
  Filled 2015-07-27 (×4): qty 1

## 2015-07-27 MED ORDER — ACETAMINOPHEN 325 MG PO TABS
650.0000 mg | ORAL_TABLET | ORAL | Status: DC | PRN
  Administered 2015-07-27: 650 mg via ORAL
  Filled 2015-07-27: qty 2

## 2015-07-27 MED ORDER — MAGNESIUM SULFATE BOLUS VIA INFUSION
4.0000 g | Freq: Once | INTRAVENOUS | Status: AC
  Administered 2015-07-27: 4 g via INTRAVENOUS
  Filled 2015-07-27: qty 500

## 2015-07-27 MED ORDER — OXYCODONE-ACETAMINOPHEN 5-325 MG PO TABS
1.0000 | ORAL_TABLET | Freq: Four times a day (QID) | ORAL | Status: DC | PRN
  Administered 2015-07-27 (×2): 1 via ORAL
  Administered 2015-07-28 – 2015-07-29 (×3): 2 via ORAL
  Filled 2015-07-27: qty 2
  Filled 2015-07-27: qty 1
  Filled 2015-07-27 (×2): qty 2
  Filled 2015-07-27: qty 1

## 2015-07-27 MED ORDER — LACTATED RINGERS IV SOLN
INTRAVENOUS | Status: DC
  Administered 2015-07-27 – 2015-07-28 (×4): via INTRAVENOUS
  Administered 2015-07-29: 500 mL via INTRAVENOUS

## 2015-07-27 MED ORDER — PRENATAL MULTIVITAMIN CH
1.0000 | ORAL_TABLET | Freq: Every day | ORAL | Status: DC
  Administered 2015-07-27 – 2015-07-29 (×3): 1 via ORAL
  Filled 2015-07-27 (×4): qty 1

## 2015-07-27 MED ORDER — CALCIUM CARBONATE ANTACID 500 MG PO CHEW
2.0000 | CHEWABLE_TABLET | ORAL | Status: DC | PRN
  Filled 2015-07-27: qty 2

## 2015-07-27 MED ORDER — MAGNESIUM SULFATE 50 % IJ SOLN
1.5000 g/h | INTRAVENOUS | Status: DC
  Administered 2015-07-27: 2 g/h via INTRAVENOUS
  Administered 2015-07-29: 1.5 g/h via INTRAVENOUS
  Filled 2015-07-27 (×3): qty 80

## 2015-07-27 NOTE — Progress Notes (Signed)
Mild frontal HA  Filed Vitals:   07/27/15 1124 07/27/15 1709  BP: 103/74   Pulse: 87   Temp:  97.9 F (36.6 C)  Resp:  18   Lungs CTA DTR 2+  FHT cat one UCs about 2-3 / hour  Dr Otho PerlNitsche called to discuss U/S he read after he wrote his consult note. He mentioned cx funneling with debris in funnel. In view of this, he recommends magnesium sulfate for 12-24 hours after BMTZ #2. He also recommends inpatient observation for 2-3 days and then repeat U/S. Report is pending but I am told fetus is transverse. D/W patient above. D/W C/S for delivery if still transverse.

## 2015-07-27 NOTE — MAU Note (Signed)
Contractions started about 10:30 and also had some spotting on tissue when wiping. High risk for pre term

## 2015-07-27 NOTE — H&P (Signed)
Roberta Wood is a 35 y.o. female presenting for pre term ctx. Maternal Medical History:  Reason for admission: Contractions.   Contractions: Onset was 3-5 hours ago.   Frequency: regular.   Perceived severity is moderate.    Fetal activity: Perceived fetal activity is normal.   Last perceived fetal movement was within the past hour.      OB History    Gravida Para Term Preterm AB TAB SAB Ectopic Multiple Living   1 1  1      1      Past Medical History  Diagnosis Date  . Abnormal Pap smear   . Headache(784.0)   . PROM (premature rupture of membranes) 11/13/12   Past Surgical History  Procedure Laterality Date  . Colposcopy    . Wisdom tooth extraction    . Endometrial biopsy    . Cesarean section N/A 12/25/2012    Procedure: Primary CESAREAN SECTION of baby boy  at 2007 APGAR 7/8;  Surgeon: Zelphia CairoGretchen Adkins, MD;  Location: [facility];  Service: Obstetrics;  Laterality: N/A;   Family History: family history includes Heart disease in her father. Social History:  reports that she has never smoked. She does not have any smokeless tobacco history on file. She reports that she drinks alcohol. She reports that she does not use illicit drugs.   Prenatal Transfer Tool  Maternal Diabetes: No Genetic Screening: Normal Maternal Ultrasounds/Referrals: Normal Fetal Ultrasounds or other Referrals:  None Maternal Substance Abuse:  No Significant Maternal Medications:  None Significant Maternal Lab Results:  None Other Comments:  None  ROS  Dilation: Closed Blood pressure 118/77, pulse 97, temperature 98.6 F (37 C), temperature source Oral, resp. rate 18, unknown if currently breastfeeding. Exam Physical Exam  Constitutional: She is oriented to person, place, and time. She appears well-developed and well-nourished.  HENT:  Head: Normocephalic and atraumatic.  Neck: Normal range of motion. Neck supple.  Cardiovascular: Normal rate and regular rhythm.   Respiratory: Effort  normal and breath sounds normal.  GI:  32 cm FHR 142  Genitourinary:  FFN NEG, cx closed  Musculoskeletal: Normal range of motion.  Neurological: She is alert and oriented to person, place, and time.    Prenatal labs: ABO, Rh:   Antibody:   Rubella:   RPR:    HBsAg:    HIV:    GBS:     Assessment/ 32+ wks, PTL  Adm for MgSO4 and BMZ     Roberta Wood 07/27/2015, 4:03 AM

## 2015-07-27 NOTE — Progress Notes (Signed)
Contractions lighter on magnesium sulfate No ROM  Filed Vitals:   07/27/15 0652 07/27/15 0711  BP: 106/68   Pulse: 85 97  Temp:    Resp:      Lungs CTA Cor RRR Abdomen no epigastric tenderness DTR 2+  Magnesium sulfate infusing BMTZ #1 done  FHT cat one UCs irregular  A/P: PT UCs with bloody show and history of PTD with C/S         D/W patient and husband-continue IV magnesium sulfate, finish BMTZ series, U/S for growth/cx/MFM consult

## 2015-07-27 NOTE — Progress Notes (Signed)
Patient states that IV is pulling a bit, retaped along arm. Noticed redness where tape was. States she has reactions to most adhesives. Will continue to assess.

## 2015-07-27 NOTE — Consult Note (Signed)
MATERNAL FETAL MEDICINE CONSULT  Patient Name: Roberta Wood Medical Record Number:  [id number] Date of Birth: Nov 27, 1980 Requesting Physician Name:  Richarda Overlieichard Holland, MD Date of Service: 07/27/2015  Chief Complaint Preterm contractions  History of Present Illness Roberta Sorrowshley M Bellucci was seen today secondary to preterm contractions at the request of Richarda Overlieichard Holland, MD.  The patient is a 35 y.o. G2P0101,at 1265w1d with an EDD of 09/13/2015, by Other Basis dating method.  She was admitted last evening after experiencing moderately to severely painful uterine contractions and vaginal spotting.  Overnight her bleeding has decreased and her contractions are less strong.  Her fetus is active.  She has no had any leakage of fluid.   Review of Systems Pertinent items are noted in HPI.  Patient History OB History  Gravida Para Term Preterm AB SAB TAB Ectopic Multiple Living  2 1  1      1     # Outcome Date GA Lbr Len/2nd Weight Sex Delivery Anes PTL Lv  2 Current           1 Preterm 12/25/12 822w4d   M CS-LTranv Spinal  Y      Past Medical History  Diagnosis Date  . Abnormal Pap smear   . Headache(784.0)   . PROM (premature rupture of membranes) 11/13/12    Past Surgical History  Procedure Laterality Date  . Colposcopy    . Wisdom tooth extraction    . Endometrial biopsy    . Cesarean section N/A 12/25/2012    Procedure: Primary CESAREAN SECTION of baby boy  at 2007 APGAR 7/8;  Surgeon: Zelphia CairoGretchen Adkins, MD;  Location: [facility];  Service: Obstetrics;  Laterality: N/A;    Social History   Social History  . Marital Status: Married    Spouse Name: N/A  . Number of Children: N/A  . Years of Education: N/A   Social History Main Topics  . Smoking status: Never Smoker   . Smokeless tobacco: None  . Alcohol Use: Yes  . Drug Use: No  . Sexual Activity: Yes   Other Topics Concern  . None   Social History Narrative    Family History  Problem Relation Age of Onset  . Heart  disease Father    In addition, the patient has no family history of mental retardation, birth defects, or genetic diseases.  Physical Examination Filed Vitals:   07/27/15 0953 07/27/15 1124  BP: 102/59 103/74  Pulse: 94 87  Temp:    Resp: 16    General appearance - alert, well appearing, and in no distress Mental status - alert, oriented to person, place, and time  Assessment and Recommendations 1.  Preterm contractions.  Fortunately, Ms. Rau had a negative fetal fibronectin upon admission yesterday.  This makes her risk of delivery in the next 2 weeks very low.  She will have a cervical length measurement later today.  I recommend she complete the course of betamethasone.  After her second dose of betamethasone tocolysis with nifedipine and dismiss her if contractions and cervical change do not occur after a short period of observation.  I spent 20 minutes with Ms. Pam today of which 50% was face-to-face counseling.  [facility].  Please do not hesitate to contact us with questions.   Rema FendtNITSCHE,Vendetta Pittinger, MD

## 2015-07-27 NOTE — MAU Provider Note (Signed)
History     CSN: [id number]  Arrival date and time: 07/27/15 0154   First Provider Initiated Contact with Patient 07/27/15 0236      Chief Complaint  Patient presents with  . Contractions  . Vaginal Bleeding   HPI Comments: Roberta Wood is a 35 y.o. G1P0101 at 33 weeks 1 day who presents today with contractions. She states that the contractions started around 2200. She also has had some spotting. She denies any leaking of fluid. She confirms fetal movement. She states that she was checked 2 weeks ago in the office and she believes she was closed. She was seen yesterday, and was not checked. She has been getting 17-P injections.    Past Medical History  Diagnosis Date  . Abnormal Pap smear   . Headache(784.0)   . PROM (premature rupture of membranes) 11/13/12    Past Surgical History  Procedure Laterality Date  . Colposcopy    . Wisdom tooth extraction    . Endometrial biopsy    . Cesarean section N/A 12/25/2012    Procedure: Primary CESAREAN SECTION of baby boy  at 2007 APGAR 7/8;  Surgeon: Zelphia Cairo, MD;  Location: [facility];  Service: Obstetrics;  Laterality: N/A;    Family History  Problem Relation Age of Onset  . Heart disease Father     Social History  Substance Use Topics  . Smoking status: Never Smoker   . Smokeless tobacco: Not on file  . Alcohol Use: Yes    Allergies:  Allergies  Allergen Reactions  . Penicillins Hives    Prescriptions prior to admission  Medication Sig Dispense Refill Last Dose  . calcium carbonate (TUMS - DOSED IN MG ELEMENTAL CALCIUM) 500 MG chewable tablet Chew 1 tablet by mouth daily as needed for heartburn.   07/27/2015 at Unknown time  . Prenatal Vit-Fe Fumarate-FA (PRENATAL MULTIVITAMIN) TABS Take 1 tablet by mouth daily at 12 noon.   07/27/2015 at Unknown time  . HYDROmorphone (DILAUDID) 2 MG tablet Take 1 tablet (2 mg total) by mouth every 4 (four) hours as needed for pain. 40 tablet 0   . Loratadine (CLARITIN PO) Take  1 each by mouth daily as needed (allergies).   Past Week at Unknown  . penciclovir (DENAVIR) 1 % cream Apply 1 application topically every 2 (two) hours as needed (cold soar).   11/12/2012 at Unknown    Review of Systems  Constitutional: Negative for fever and chills.  Gastrointestinal: Positive for abdominal pain. Negative for nausea, vomiting, diarrhea and constipation.  Genitourinary: Negative for dysuria, urgency and frequency.   Physical Exam   Blood pressure 113/72, pulse 97, temperature 98.6 F (37 C), temperature source Oral, resp. rate 18, unknown if currently breastfeeding.  Physical Exam  Nursing note and vitals reviewed. Constitutional: She is oriented to person, place, and time. She appears well-developed and well-nourished. No distress.  HENT:  Head: Normocephalic.  Cardiovascular: Normal rate.   Respiratory: Effort normal.  GI: Soft. There is no tenderness. There is no rebound.  Genitourinary:  Cervix: closed/thick/soft/bloody show with exam.   Neurological: She is alert and oriented to person, place, and time.  Skin: Skin is warm.  Psychiatric: She has a normal mood and affect.  FHT: 150, moderate with 15x15 accels, small variable.  Toco: q 2-4 mins   Results for orders placed or performed during the hospital encounter of 07/27/15 (from the past 24 hour(s))  Wet prep, genital     Status: Abnormal   Collection  Time: 07/27/15  2:45 AM  Result Value Ref Range   Yeast Wet Prep HPF POC PRESENT (A) NONE SEEN   Trich, Wet Prep NONE SEEN NONE SEEN   Clue Cells Wet Prep HPF POC NONE SEEN NONE SEEN   WBC, Wet Prep HPF POC FEW (A) NONE SEEN   Sperm NONE SEEN   Fetal fibronectin     Status: None   Collection Time: 07/27/15  2:45 AM  Result Value Ref Range   Fetal Fibronectin NEGATIVE NEGATIVE    MAU Course  Procedures  MDM [id number]: D/W Dr. Marcelle OverlieHolland. He would like a call when the FFN is back.  [id number]: D/W Dr. Marcelle OverlieHolland, will admit to ante for Mag, BMZ.   Assessment and  Plan  Preterm contractions History of preterm delivery Will admit to ante for obs, BMZ and Magnesium   Tawnya CrookHogan, Heather Donovan 07/27/2015, 2:38 AM

## 2015-07-28 MED ORDER — ONDANSETRON 4 MG PO TBDP
4.0000 mg | ORAL_TABLET | Freq: Four times a day (QID) | ORAL | Status: DC | PRN
  Administered 2015-07-28: 4 mg via ORAL
  Filled 2015-07-28 (×2): qty 1

## 2015-07-28 MED ORDER — FAMOTIDINE 20 MG PO TABS
20.0000 mg | ORAL_TABLET | Freq: Two times a day (BID) | ORAL | Status: DC
  Administered 2015-07-28 – 2015-07-29 (×3): 20 mg via ORAL
  Filled 2015-07-28 (×3): qty 1

## 2015-07-28 NOTE — Progress Notes (Signed)
Had HA yesterday treated with percocet and magnesium decreased to 1.5 gm/hr. Now HA mild and intermittent. Scant dried blood when wiping.  Filed Vitals:   07/28/15 0414 07/28/15 0500  BP: 81/56   Pulse: 80   Temp: 98.1 F (36.7 C)   Resp: 16 18   Uterus NT, soft Lungs CTA DTR 1+  Magnesium sulfate @ 1.5 gm/hr FHT cat one UCs 4-6 / hour, mild  BMTZ #2 done at 4am today  A/P: PTC, Hx PTD, cervical funneling, debris in cervix on U/S, possible marginal abruption or infection         Continue magnesium until tomorrow am         U/S Monday am         Transverse-cesarean section for delivery         D/W patient . She states she understands and agrees

## 2015-07-29 ENCOUNTER — Inpatient Hospital Stay (HOSPITAL_COMMUNITY): Payer: BLUE CROSS/BLUE SHIELD | Admitting: Anesthesiology

## 2015-07-29 ENCOUNTER — Encounter (HOSPITAL_COMMUNITY)
Admission: AD | Disposition: A | Discharge status: Home / Self Care | Payer: Self-pay | Source: Ambulatory Visit | Attending: Obstetrics and Gynecology

## 2015-07-29 ENCOUNTER — Encounter (HOSPITAL_COMMUNITY): Payer: Self-pay | Admitting: Anesthesiology

## 2015-07-29 LAB — COMPREHENSIVE METABOLIC PANEL
ALBUMIN: 2.9 g/dL — AB (ref 3.5–5.0)
ALK PHOS: 141 U/L — AB (ref 38–126)
ALT: 14 U/L (ref 14–54)
ANION GAP: 8 (ref 5–15)
AST: 25 U/L (ref 15–41)
CALCIUM: 8.4 mg/dL — AB (ref 8.9–10.3)
CO2: 24 mmol/L (ref 22–32)
Chloride: 104 mmol/L (ref 101–111)
Creatinine, Ser: 0.49 mg/dL (ref 0.44–1.00)
GFR calc Af Amer: >60 mL/min (ref >60)
GFR calc non Af Amer: >60 mL/min (ref >60)
GLUCOSE: 105 mg/dL — AB (ref 65–99)
POTASSIUM: 4 mmol/L (ref 3.5–5.1)
SODIUM: 136 mmol/L (ref 135–145)
Total Bilirubin: 0.6 mg/dL (ref 0.3–1.2)
Total Protein: 6.4 g/dL — ABNORMAL LOW (ref 6.5–8.1)

## 2015-07-29 LAB — CBC
HCT: 32.1 % — ABNORMAL LOW (ref 36.0–46.0)
HCT: 36 % (ref 36.0–46.0)
HEMOGLOBIN: 12.1 g/dL (ref 12.0–15.0)
Hemoglobin: 10.5 g/dL — ABNORMAL LOW (ref 12.0–15.0)
MCH: 31.1 pg (ref 26.0–34.0)
MCH: 31.3 pg (ref 26.0–34.0)
MCHC: 32.7 g/dL (ref 30.0–36.0)
MCHC: 33.6 g/dL (ref 30.0–36.0)
MCV: 95 fL (ref 78.0–100.0)
MCV: 93.3 fL (ref 78.0–100.0)
PLATELETS: 241 10*3/uL (ref 150–400)
Platelets: 250 10*3/uL (ref 150–400)
RBC: 3.38 MIL/uL — AB (ref 3.87–5.11)
RBC: 3.86 MIL/uL — ABNORMAL LOW (ref 3.87–5.11)
RDW: 13.9 % (ref 11.5–15.5)
RDW: 13.5 % (ref 11.5–15.5)
WBC: 13.2 10*3/uL — AB (ref 4.0–10.5)
WBC: 17.6 10*3/uL — AB (ref 4.0–10.5)

## 2015-07-29 SURGERY — Surgical Case
Anesthesia: Regional

## 2015-07-29 MED ORDER — NALOXONE HCL 2 MG/2ML IJ SOSY
1.0000 ug/kg/h | PREFILLED_SYRINGE | INTRAVENOUS | Status: DC | PRN
  Filled 2015-07-29: qty 2

## 2015-07-29 MED ORDER — BUPIVACAINE IN DEXTROSE 0.75-8.25 % IT SOLN
INTRATHECAL | Status: DC | PRN
  Administered 2015-07-29: 1.6 mL via INTRATHECAL

## 2015-07-29 MED ORDER — MAGNESIUM HYDROXIDE 400 MG/5ML PO SUSP
30.0000 mL | Freq: Once | ORAL | Status: AC
  Administered 2015-07-29: 30 mL via ORAL
  Filled 2015-07-29: qty 30

## 2015-07-29 MED ORDER — KETOROLAC TROMETHAMINE 30 MG/ML IJ SOLN
30.0000 mg | Freq: Four times a day (QID) | INTRAMUSCULAR | Status: DC | PRN
  Administered 2015-07-30: 30 mg via INTRAMUSCULAR

## 2015-07-29 MED ORDER — SODIUM CHLORIDE 0.9 % IR SOLN
Status: DC | PRN
  Administered 2015-07-29: 1

## 2015-07-29 MED ORDER — MEPERIDINE HCL 25 MG/ML IJ SOLN
INTRAMUSCULAR | Status: AC
  Filled 2015-07-29: qty 1

## 2015-07-29 MED ORDER — LACTATED RINGERS IV SOLN
INTRAVENOUS | Status: DC | PRN
  Administered 2015-07-29 (×2): via INTRAVENOUS

## 2015-07-29 MED ORDER — NALBUPHINE HCL 10 MG/ML IJ SOLN: 5.0000 mg | Freq: Once | INTRAMUSCULAR | Status: DC | PRN

## 2015-07-29 MED ORDER — NALBUPHINE HCL 10 MG/ML IJ SOLN: 5.0000 mg | INTRAMUSCULAR | Status: DC | PRN

## 2015-07-29 MED ORDER — CITRIC ACID-SODIUM CITRATE 334-500 MG/5ML PO SOLN
ORAL | Status: AC
  Administered 2015-07-29: 30 mL
  Filled 2015-07-29: qty 15

## 2015-07-29 MED ORDER — SCOPOLAMINE 1 MG/3DAYS TD PT72
MEDICATED_PATCH | TRANSDERMAL | Status: AC
  Filled 2015-07-29: qty 1

## 2015-07-29 MED ORDER — MEPERIDINE HCL 25 MG/ML IJ SOLN
INTRAMUSCULAR | Status: DC | PRN
  Administered 2015-07-29: 12.5 mg via INTRAVENOUS

## 2015-07-29 MED ORDER — PHENYLEPHRINE 8 MG IN D5W 100 ML (0.08MG/ML) PREMIX OPTIME
INJECTION | INTRAVENOUS | Status: DC | PRN
  Administered 2015-07-29: 60 ug/min via INTRAVENOUS

## 2015-07-29 MED ORDER — SODIUM CHLORIDE 0.9% FLUSH: 3.0000 mL | INTRAVENOUS | Status: DC | PRN

## 2015-07-29 MED ORDER — SCOPOLAMINE 1 MG/3DAYS TD PT72: 1.0000 | MEDICATED_PATCH | Freq: Once | TRANSDERMAL | Status: DC

## 2015-07-29 MED ORDER — MORPHINE SULFATE (PF) 0.5 MG/ML IJ SOLN
INTRAMUSCULAR | Status: AC
Start: 2015-07-29 — End: 2015-07-29
  Filled 2015-07-29: qty 10

## 2015-07-29 MED ORDER — NALOXONE HCL 0.4 MG/ML IJ SOLN: 0.4000 mg | INTRAMUSCULAR | Status: DC | PRN

## 2015-07-29 MED ORDER — ACETAMINOPHEN 500 MG PO TABS
1000.0000 mg | ORAL_TABLET | Freq: Four times a day (QID) | ORAL | Status: AC
  Administered 2015-07-30 (×3): 1000 mg via ORAL
  Filled 2015-07-29 (×3): qty 2

## 2015-07-29 MED ORDER — FENTANYL CITRATE (PF) 100 MCG/2ML IJ SOLN
INTRAMUSCULAR | Status: DC | PRN
  Administered 2015-07-29: 20 ug via INTRAVENOUS

## 2015-07-29 MED ORDER — ONDANSETRON HCL 4 MG/2ML IJ SOLN
INTRAMUSCULAR | Status: DC | PRN
  Administered 2015-07-29: 4 mg via INTRAVENOUS

## 2015-07-29 MED ORDER — KETOROLAC TROMETHAMINE 30 MG/ML IJ SOLN: 30.0000 mg | Freq: Four times a day (QID) | INTRAMUSCULAR | Status: DC | PRN

## 2015-07-29 MED ORDER — DEXTROSE 5 % IV SOLN
INTRAVENOUS | Status: AC
  Administered 2015-07-29: 114.25 mL via INTRAVENOUS
  Filled 2015-07-29: qty 8.25

## 2015-07-29 MED ORDER — LACTATED RINGERS IV SOLN
40.0000 [IU] | INTRAVENOUS | Status: DC | PRN
  Administered 2015-07-29: 40 [IU] via INTRAVENOUS

## 2015-07-29 MED ORDER — FENTANYL CITRATE (PF) 100 MCG/2ML IJ SOLN
INTRAMUSCULAR | Status: AC
  Filled 2015-07-29: qty 2

## 2015-07-29 MED ORDER — ONDANSETRON HCL 4 MG/2ML IJ SOLN
INTRAMUSCULAR | Status: AC
Start: 2015-07-29 — End: 2015-07-29
  Filled 2015-07-29: qty 2

## 2015-07-29 MED ORDER — OXYTOCIN 10 UNIT/ML IJ SOLN
INTRAMUSCULAR | Status: AC
  Filled 2015-07-29: qty 4

## 2015-07-29 MED ORDER — MEPERIDINE HCL 25 MG/ML IJ SOLN: 6.2500 mg | INTRAMUSCULAR | Status: DC | PRN

## 2015-07-29 MED ORDER — MORPHINE SULFATE (PF) 0.5 MG/ML IJ SOLN
INTRAMUSCULAR | Status: DC | PRN
  Administered 2015-07-29: .2 mg via EPIDURAL

## 2015-07-29 MED ORDER — PROCHLORPERAZINE EDISYLATE 5 MG/ML IJ SOLN: 10.0000 mg | Freq: Once | INTRAMUSCULAR | Status: DC | PRN

## 2015-07-29 MED ORDER — ONDANSETRON HCL 4 MG/2ML IJ SOLN: 4.0000 mg | Freq: Three times a day (TID) | INTRAMUSCULAR | Status: DC | PRN

## 2015-07-29 MED ORDER — DEXAMETHASONE SODIUM PHOSPHATE 4 MG/ML IJ SOLN
INTRAMUSCULAR | Status: AC
  Filled 2015-07-29: qty 1

## 2015-07-29 MED ORDER — FENTANYL CITRATE (PF) 100 MCG/2ML IJ SOLN: 25.0000 ug | INTRAMUSCULAR | Status: DC | PRN

## 2015-07-29 MED ORDER — SCOPOLAMINE 1 MG/3DAYS TD PT72
MEDICATED_PATCH | TRANSDERMAL | Status: DC | PRN
  Administered 2015-07-29: 1 via TRANSDERMAL

## 2015-07-29 MED ORDER — DEXAMETHASONE SODIUM PHOSPHATE 4 MG/ML IJ SOLN
INTRAMUSCULAR | Status: DC | PRN
  Administered 2015-07-29: 4 mg via INTRAVENOUS

## 2015-07-29 SURGICAL SUPPLY — 39 items
APL SKNCLS STERI-STRIP NONHPOA (GAUZE/BANDAGES/DRESSINGS) ×1
BENZOIN TINCTURE PRP APPL 2/3 (GAUZE/BANDAGES/DRESSINGS) ×2 IMPLANT
CLAMP CORD UMBIL (MISCELLANEOUS) IMPLANT
CLOSURE STERI STRIP 1/2 X4 (GAUZE/BANDAGES/DRESSINGS) ×2 IMPLANT
CLOSURE WOUND 1/2 X4 (GAUZE/BANDAGES/DRESSINGS)
CLOTH BEACON ORANGE TIMEOUT ST (SAFETY) ×3 IMPLANT
CONTAINER PREFILL 10% NBF 15ML (MISCELLANEOUS) IMPLANT
DRSG OPSITE POSTOP 4X10 (GAUZE/BANDAGES/DRESSINGS) ×3 IMPLANT
DURAPREP 26ML APPLICATOR (WOUND CARE) ×3 IMPLANT
ELECT REM PT RETURN 9FT ADLT (ELECTROSURGICAL) ×3
ELECTRODE REM PT RTRN 9FT ADLT (ELECTROSURGICAL) ×1 IMPLANT
EXTRACTOR VACUUM M CUP 4 TUBE (SUCTIONS) IMPLANT
EXTRACTOR VACUUM M CUP 4' TUBE (SUCTIONS)
GLOVE BIO SURGEON STRL SZ8 (GLOVE) ×3 IMPLANT
GLOVE BIOGEL PI IND STRL 7.0 (GLOVE) ×1 IMPLANT
GLOVE BIOGEL PI INDICATOR 7.0 (GLOVE) ×2
GOWN STRL REUS W/TWL LRG LVL3 (GOWN DISPOSABLE) ×6 IMPLANT
KIT ABG SYR 3ML LUER SLIP (SYRINGE) ×3 IMPLANT
LIQUID BAND (GAUZE/BANDAGES/DRESSINGS) IMPLANT
NDL HYPO 25X5/8 SAFETYGLIDE (NEEDLE) ×1 IMPLANT
NEEDLE HYPO 25X5/8 SAFETYGLIDE (NEEDLE) ×3 IMPLANT
NS IRRIG 1000ML POUR BTL (IV SOLUTION) ×3 IMPLANT
PACK C SECTION WH (CUSTOM PROCEDURE TRAY) ×3 IMPLANT
PAD ABD 8X10 STRL (GAUZE/BANDAGES/DRESSINGS) ×2 IMPLANT
PAD OB MATERNITY 4.3X12.25 (PERSONAL CARE ITEMS) ×3 IMPLANT
PENCIL SMOKE EVAC W/HOLSTER (ELECTROSURGICAL) ×3 IMPLANT
SPONGE GAUZE 4X4 12PLY STER LF (GAUZE/BANDAGES/DRESSINGS) ×2 IMPLANT
STRIP CLOSURE SKIN 1/2X4 (GAUZE/BANDAGES/DRESSINGS) IMPLANT
SUT MNCRL 0 VIOLET CTX 36 (SUTURE) ×4 IMPLANT
SUT MONOCRYL 0 CTX 36 (SUTURE) ×8
SUT PDS AB 0 CTX 60 (SUTURE) ×3 IMPLANT
SUT PLAIN 0 NONE (SUTURE) IMPLANT
SUT PLAIN 2 0 (SUTURE)
SUT PLAIN 2 0 XLH (SUTURE) IMPLANT
SUT PLAIN ABS 2-0 CT1 27XMFL (SUTURE) IMPLANT
SUT VIC AB 4-0 KS 27 (SUTURE) ×3 IMPLANT
TAPE CLOTH SURG 4X10 WHT LF (GAUZE/BANDAGES/DRESSINGS) ×2 IMPLANT
TOWEL OR 17X24 6PK STRL BLUE (TOWEL DISPOSABLE) ×3 IMPLANT
TRAY FOLEY CATH SILVER 14FR (SET/KITS/TRAYS/PACK) ×3 IMPLANT

## 2015-07-29 NOTE — Transfer of Care (Signed)
Immediate Anesthesia Transfer of Care Note  Patient: Roberta Wood  Procedure(s) Performed: Procedure(s): CESAREAN SECTION (N/A)  Patient Location: PACU  Anesthesia Type:Spinal  Level of Consciousness: awake, alert  and oriented  Airway & Oxygen Therapy: Patient Spontanous Breathing  Post-op Assessment: Report given to RN and Post -op Vital signs reviewed and stable  Post vital signs: Reviewed and stable  Last Vitals:  Filed Vitals:   07/29/15 1534 07/29/15 2044  BP: 110/69 117/67  Pulse: 75 79  Temp: 36.8 C 36.7 C  Resp: 16 18    Last Pain:  Filed Vitals:   07/29/15 2256  PainSc: 10-Worst pain ever      Patients Stated Pain Goal: 0 (07/27/15 2029)  Complications: No apparent anesthesia complications

## 2015-07-29 NOTE — Anesthesia Procedure Notes (Signed)
Spinal Patient location during procedure: OR Staffing Anesthesiologist: Sherrian DiversENENNY, Remingtyn Depaola Preanesthetic Checklist Completed: patient identified, site marked, surgical consent, pre-op evaluation, timeout performed, IV checked, risks and benefits discussed and monitors and equipment checked Spinal Block Patient position: sitting Prep: DuraPrep Patient monitoring: heart rate, cardiac monitor, continuous pulse ox and blood pressure Approach: midline Location: L4-5 Injection technique: single-shot Needle Needle type: Pencan  Needle gauge: 24 G Needle length: 10 cm Assessment Sensory level: T4 Additional Notes Tolerated well. CSF clear. No paresthesia.

## 2015-07-29 NOTE — Consult Note (Signed)
Neonatology Note:   Attendance at C-section:    I was asked by Dr. Henderson Cloudomblin to attend this  C/S at 33 weeks due to PTL with question of abruption (h/o marginal abruption). The mother is a G1, GBS not done with good prenatal care. S/p BTMZ 5/13 and Mag.  ROM 0 hours before delivery, fluid clear. Good cry and tone at birth.  Brief delay cord clamping.  Brought to warmer and dried and stimulated.  HR >100.  Lung sounds bilateral coarse at bases.  Sao2 75%. CPAP initiated with max fio2 40% with good response.  Fio2 weaned to 21% and then CPAP removed.  Infant with clear lungs and cofortable WOB with sao2 in high 90s. Father and mother updated and allowed to hold for a short period.  Infant then placed in transport isolette and admitted to NICU due to prematurity. [place] a[place] and further updated.   Dineen Kidavid C. Leary RocaEhrmann, MD

## 2015-07-29 NOTE — Progress Notes (Signed)
Pt transported to OR via bed, she wad accompanied by patients nurse, L&D nurse and [facility]. Pt tolerated the ride fairly well with a few contraction , she was able to breath through them.

## 2015-07-29 NOTE — Brief Op Note (Signed)
07/27/2015 - 07/29/2015  10:50 PM  PATIENT:  Roberta Wood  35 y.o. female  PRE-OPERATIVE DIAGNOSIS:  Preterm labor, previous Cesarean Section   POST-OPERATIVE DIAGNOSIS:  Repeat Cesarean Section   PROCEDURE:  Procedure(s): CESAREAN SECTION (N/A)  SURGEON:  Surgeon(s) and Role:    * Lazaro ArmsLuther H Eure, MD - Assisting    * Harold HedgeJames Jahmya Onofrio, MD - Primary  PHYSICIAN ASSISTANT:   ASSISTANTS: none   ANESTHESIA:   spinal  EBL:  Total I/O In: 1000 [I.V.:1000] Out: 1100 [Urine:500; Blood:600]  BLOOD ADMINISTERED:none  DRAINS: Urinary Catheter (Foley)   LOCAL MEDICATIONS USED:  NONE  SPECIMEN:  Source of Specimen:  placenta  DISPOSITION OF SPECIMEN:  PATHOLOGY  COUNTS:  YES  TOURNIQUET:  * No tourniquets in log *  DICTATION: .Other Dictation: Dictation Number [id number]  PLAN OF CARE: Admit to inpatient   PATIENT DISPOSITION:  PACU - hemodynamically stable.   Delay start of Pharmacological VTE agent (>24hrs) due to surgical blood loss or risk of bleeding: not applicable

## 2015-07-29 NOTE — Anesthesia Preprocedure Evaluation (Addendum)
Anesthesia Evaluation  Patient identified by MRN, date of birth, ID band Patient awake    Reviewed: Allergy & Precautions, NPO status , Patient's Chart, lab work & pertinent test results  Airway Mallampati: II  TM Distance: >3 FB Neck ROM: Full    Dental no notable dental hx.    Pulmonary neg pulmonary ROS,    Pulmonary exam normal breath sounds clear to auscultation       Cardiovascular negative cardio ROS Normal cardiovascular exam Rhythm:Regular Rate:Normal     Neuro/Psych  Headaches, negative psych ROS   GI/Hepatic negative GI ROS, Neg liver ROS,   Endo/Other  negative endocrine ROS  Renal/GU negative Renal ROS  negative genitourinary   Musculoskeletal negative musculoskeletal ROS (+)   Abdominal   Peds negative pediatric ROS (+)  Hematology negative hematology ROS (+)   Anesthesia Other Findings   Reproductive/Obstetrics negative OB ROS                             Anesthesia Physical Anesthesia Plan  ASA: II and emergent  Anesthesia Plan: Spinal   Post-op Pain Management:    Induction: Intravenous  Airway Management Planned: Natural Airway  Additional Equipment:   Intra-op Plan:   Post-operative Plan:   Informed Consent: I have reviewed the patients History and Physical, chart, labs and discussed the procedure including the risks, benefits and alternatives for the proposed anesthesia with the patient or authorized representative who has indicated his/her understanding and acceptance.   Dental advisory given  Plan Discussed with: CRNA  Anesthesia Plan Comments: (Discussed risks and benefits of and differences between spinal and general. Discussed risks of spinal including headache, backache, failure, bleeding and hematoma, infection, and nerve damage. Patient consents to spinal. Questions answered. Platelet count acceptable.  Previous CS. Transverse lie. In labor with  painful contractions.)       Anesthesia Quick Evaluation

## 2015-07-29 NOTE — Progress Notes (Signed)
33 3/7 weeks  Scant dark brown on tissue  VSS Afeb  FHT cat one UCs 4-5/hr, mild DTR 1+  A/P: PTL         Hx PTD         Transverse-cesarean section for delivery         D/C magnesium sulfate         U/S and reevaluate in am

## 2015-07-29 NOTE — Progress Notes (Signed)
C/O strong UCs. HA gone  VSS Afeb Patient crying with strong contractions  Lungs CTA Cor RRR Uterus-firm UCs, soft between UCs DTR 2+  Beside U/S- vtx in RUQ  FHT cat one UCs q2-3 min  A/P: Labor         Recommend cesarean section         D/W patient above and risks including infection, organ damage, bleeding/transfusion-HIV/Hep, DVT/PE, pneumonia. All questions answered. Patient states she understands and agrees.

## 2015-07-29 NOTE — Progress Notes (Signed)
Patient's husband was called  and informed of stat C/S. He promise to get a Arts administratorbaby sitter and then he will be on his way to the hospital.

## 2015-07-30 ENCOUNTER — Encounter (HOSPITAL_COMMUNITY): Payer: Self-pay | Admitting: *Deleted

## 2015-07-30 LAB — CBC
HCT: 33.5 % — ABNORMAL LOW (ref 36.0–46.0)
HEMOGLOBIN: 11.3 g/dL — AB (ref 12.0–15.0)
MCH: 31.2 pg (ref 26.0–34.0)
MCHC: 33.7 g/dL (ref 30.0–36.0)
MCV: 92.5 fL (ref 78.0–100.0)
PLATELETS: 232 10*3/uL (ref 150–400)
RBC: 3.62 MIL/uL — AB (ref 3.87–5.11)
RDW: 13.5 % (ref 11.5–15.5)
WBC: 18.4 10*3/uL — AB (ref 4.0–10.5)

## 2015-07-30 LAB — GC/CHLAMYDIA PROBE AMP (~~LOC~~) NOT AT ARMC
Chlamydia: NEGATIVE
NEISSERIA GONORRHEA: NEGATIVE

## 2015-07-30 MED ORDER — OXYCODONE HCL 5 MG PO TABS: 10.0000 mg | ORAL_TABLET | ORAL | Status: DC | PRN

## 2015-07-30 MED ORDER — OXYCODONE HCL 5 MG PO TABS: 5.0000 mg | ORAL_TABLET | ORAL | Status: DC | PRN

## 2015-07-30 MED ORDER — OXYTOCIN 40 UNITS IN LACTATED RINGERS INFUSION - SIMPLE MED: 2.5000 [IU]/h | INTRAVENOUS | Status: DC

## 2015-07-30 MED ORDER — SIMETHICONE 80 MG PO CHEW: 80.0000 mg | CHEWABLE_TABLET | Freq: Three times a day (TID) | ORAL | Status: DC

## 2015-07-30 MED ORDER — DIPHENHYDRAMINE HCL 25 MG PO CAPS: 25.0000 mg | ORAL_CAPSULE | Freq: Four times a day (QID) | ORAL | Status: DC | PRN

## 2015-07-30 MED ORDER — DIBUCAINE 1 % RE OINT: 1.0000 "application " | TOPICAL_OINTMENT | RECTAL | Status: DC | PRN

## 2015-07-30 MED ORDER — SENNOSIDES-DOCUSATE SODIUM 8.6-50 MG PO TABS: 2.0000 | ORAL_TABLET | ORAL | Status: DC

## 2015-07-30 MED ORDER — ACETAMINOPHEN 325 MG PO TABS: 650.0000 mg | ORAL_TABLET | ORAL | Status: DC | PRN

## 2015-07-30 MED ORDER — COCONUT OIL OIL: 1.0000 "application " | TOPICAL_OIL | Status: DC | PRN

## 2015-07-30 MED ORDER — IBUPROFEN 600 MG PO TABS
600.0000 mg | ORAL_TABLET | Freq: Four times a day (QID) | ORAL | Status: DC
  Administered 2015-07-30 – 2015-08-01 (×9): 600 mg via ORAL
  Filled 2015-07-30 (×9): qty 1

## 2015-07-30 MED ORDER — PRENATAL MULTIVITAMIN CH: 1.0000 | ORAL_TABLET | Freq: Every day | ORAL | Status: DC

## 2015-07-30 MED ORDER — PRENATAL MULTIVITAMIN CH
1.0000 | ORAL_TABLET | Freq: Every day | ORAL | Status: DC
  Administered 2015-07-30 – 2015-07-31 (×2): 1 via ORAL
  Filled 2015-07-30 (×2): qty 1

## 2015-07-30 MED ORDER — LACTATED RINGERS IV SOLN
INTRAVENOUS | Status: DC
Start: 2015-07-30 — End: 2015-07-30
  Administered 2015-07-30: 14:00:00 via INTRAVENOUS

## 2015-07-30 MED ORDER — OXYCODONE HCL 5 MG PO TABS
5.0000 mg | ORAL_TABLET | ORAL | Status: DC | PRN
  Administered 2015-07-30 – 2015-08-01 (×5): 5 mg via ORAL
  Filled 2015-07-30 (×6): qty 1

## 2015-07-30 MED ORDER — TETANUS-DIPHTH-ACELL PERTUSSIS 5-2.5-18.5 LF-MCG/0.5 IM SUSP: 0.5000 mL | Freq: Once | INTRAMUSCULAR | Status: DC

## 2015-07-30 MED ORDER — WITCH HAZEL-GLYCERIN EX PADS: 1.0000 "application " | MEDICATED_PAD | CUTANEOUS | Status: DC | PRN

## 2015-07-30 MED ORDER — SIMETHICONE 80 MG PO CHEW: 80.0000 mg | CHEWABLE_TABLET | ORAL | Status: DC

## 2015-07-30 MED ORDER — KETOROLAC TROMETHAMINE 30 MG/ML IJ SOLN
INTRAMUSCULAR | Status: AC
  Filled 2015-07-30: qty 1

## 2015-07-30 MED ORDER — IBUPROFEN 600 MG PO TABS: 600.0000 mg | ORAL_TABLET | Freq: Four times a day (QID) | ORAL | Status: DC

## 2015-07-30 MED ORDER — SIMETHICONE 80 MG PO CHEW
80.0000 mg | CHEWABLE_TABLET | ORAL | Status: DC
  Administered 2015-07-30 – 2015-08-01 (×2): 80 mg via ORAL
  Filled 2015-07-30 (×2): qty 1

## 2015-07-30 MED ORDER — OXYCODONE-ACETAMINOPHEN 5-325 MG PO TABS: 1.0000 | ORAL_TABLET | ORAL | Status: DC | PRN

## 2015-07-30 MED ORDER — ZOLPIDEM TARTRATE 5 MG PO TABS
5.0000 mg | ORAL_TABLET | Freq: Every evening | ORAL | Status: DC | PRN
  Administered 2015-08-01: 5 mg via ORAL
  Filled 2015-07-30: qty 1

## 2015-07-30 MED ORDER — MENTHOL 3 MG MT LOZG: 1.0000 | LOZENGE | OROMUCOSAL | Status: DC | PRN

## 2015-07-30 MED ORDER — OXYTOCIN 40 UNITS IN LACTATED RINGERS INFUSION - SIMPLE MED: 2.5000 [IU]/h | INTRAVENOUS | Status: AC

## 2015-07-30 MED ORDER — SIMETHICONE 80 MG PO CHEW: 80.0000 mg | CHEWABLE_TABLET | ORAL | Status: DC | PRN

## 2015-07-30 MED ORDER — LACTATED RINGERS IV SOLN: INTRAVENOUS | Status: DC

## 2015-07-30 MED ORDER — SENNOSIDES-DOCUSATE SODIUM 8.6-50 MG PO TABS
2.0000 | ORAL_TABLET | ORAL | Status: DC
  Administered 2015-07-30: 2 via ORAL
  Filled 2015-07-30: qty 2

## 2015-07-30 MED ORDER — ZOLPIDEM TARTRATE 5 MG PO TABS: 5.0000 mg | ORAL_TABLET | Freq: Every evening | ORAL | Status: DC | PRN

## 2015-07-30 NOTE — Plan of Care (Signed)
Problem: Respiratory: Goal: Ability to maintain adequate ventilation will improve Outcome: Completed/Met Date Met:  07/30/15 Can consistently get I/S up to 2250

## 2015-07-30 NOTE — Plan of Care (Signed)
Problem: Role Relationship: Goal: Ability to demonstrate positive interaction with newborn will improve Outcome: Progressing Pt going to NICU via wheelchair now, accompanied by her husband. Sheryn BisonGordon, Damyn Weitzel Warner, RN

## 2015-07-30 NOTE — Plan of Care (Signed)
Problem: Activity: Goal: Ability to tolerate increased activity will improve Outcome: Progressing Patient did stand at bedside for orthostatics and tolerated fair.

## 2015-07-30 NOTE — Progress Notes (Signed)
Subjective: Postpartum Day 1: Cesarean Delivery Patient reports incisional pain and tolerating PO.    Objective: Vital signs in last 24 hours: Temp:  [97.8 F (36.6 C)-99.1 F (37.3 C)] 98.3 F (36.8 C) (05/15 1000) Pulse Rate:  [59-79] 67 (05/15 1000) Resp:  [12-25] 16 (05/15 1000) BP: (100-117)/(57-72) 100/57 mmHg (05/15 1000) SpO2:  [94 %-100 %] 98 % (05/15 1000)  Physical Exam:  General: alert, cooperative and appears stated age 48Lochia: appropriate Uterine Fundus: firm Incision: healing well, no significant drainage, no dehiscence DVT Evaluation: No evidence of DVT seen on physical exam. Negative Homan's sign. No cords or calf tenderness.   Recent Labs  07/29/15 2133 07/30/15 0149  HGB 12.1 11.3*  HCT 36.0 33.5*    Assessment/Plan: Status post Cesarean section. Doing well postoperatively.  Continue current care.  Roberta Wood 07/30/2015, 10:11 AM

## 2015-07-30 NOTE — Op Note (Signed)
NAME:  Roberta Wood, Roberta Wood                 ACCOUNT NO.:  MEDICAL RECORD NO.:  [id number]  LOCATION:                                 FACILITY:  PHYSICIAN:  Guy Sandifer. Henderson Cloud, M.D. DATE OF BIRTH:  10-16-1980  DATE OF PROCEDURE:  07/29/2015 DATE OF DISCHARGE:                              OPERATIVE REPORT   PREOPERATIVE DIAGNOSES: 1. Intrauterine pregnancy at 37-3/7th weeks. 2. Preterm labor. 3. Transverse lie.  POSTOPERATIVE DIAGNOSES: 1. Intrauterine pregnancy at 37-3/7th weeks. 2. Preterm labor. 3. Transverse lie.  PROCEDURE:  Repeat low transverse cesarean section.  SURGEON:  Guy Sandifer. Henderson Cloud, M.D.  ASSISTANT:  Dr. Abelino Derrick.  ANESTHESIA:  Spinal by Dr. Council Mechanic.  SPECIMENS:  Placenta to Pathology.  FINDINGS:  Viable female infant.  Apgars, arterial cord pH, and weight pending at time of dictation.  INDICATIONS AND CONSENT:  This patient is a 35 year old G2, P1, who is admitted several days ago with vaginal bleeding.  It resolved rapidly with conservative management.  She is admitted to antenatal, and ultrasound reveals the cervix to the funneling with debris in the funnel.  This was felt to be possibly suspicious of marginal abruption or intrauterine infection both of which at that time were subclinical and the patient was stable.  She was given 2 doses of betamethasone 24 hours apart and continued on magnesium sulfate for in excess of 48 hours.  Magnesium sulfate was discontinued this morning approximately 12 hours prior to delivery.  She did well through the day but this evening had the rapid onset of regular firm painful contractions.  The patient was crying with the contractions.  They palpated firm to palpation.  The uterus was soft in between.  Fetal heart tones remained category 1. Recommendation for delivery was made.  Bedside ultrasound in the room also confirmed the fetal vertex in the right upper quadrant.  Possible risks and complications were discussed with  the patient preoperatively, including but not limited to infection, organ damage, bleeding requiring transfusion of blood products with HIV and hepatitis acquisition, DVT, PE, pneumonia.  All questions were answered.  The patient states she understands and agrees.  Consent was signed on the chart.  PROCEDURE IN DETAIL:  The patient was taken to the operating room. Fetal heart tone was approximately 150 beats per minute.  Spinal anesthetic was placed.  She was placed in a dorsal supine position with 15 degree left lateral wedge.  Due to the severity of the pain and suspicion for evolving marginal abruption of the placenta, the patient was prepped with Betadine, and a Foley catheter was placed in a sterile fashion.  The vagina was also prepped.  She was then draped in a sterile fashion.  Time-out was undertaken.  After testing for adequate spinal anesthesia, skin was entered through a Pfannenstiel scar, and dissection was carried out in layers of the peritoneum which was extended superiorly and inferiorly.  Vesicouterine peritoneum was taken down cephalad-laterally.  Bladder flap was developed.  Bladder blade was placed.  Uterus was incised in a low transverse manner.  The uterine cavity was entered bluntly with a hemostat.  Clear fluid was noted.  The uterine incision was extended cephalad-laterally  with fingers.  Baby was then delivered from the frank breech position without difficulty.  Good cry and tone was noted.  Cord was clamped and cut.  The baby was handed to awaiting pediatrics team. Arterial cord gas and cord blood was drawn. Placenta was then manually delivered.  Uterine cavity was clean on inspection.  Uterus was closed in two running locking imbricating layers of 0 Monocryl suture which achieves good hemostasis.  Lavage was carried out.  Anterior peritoneum was closed in a running fashion with 0 Monocryl suture which was also used to reapproximate the pyramidalis muscle in  midline.  Anterior rectus fascia was closed in a running fashion with a 0 looped PDS suture.  Subcutaneous layers were closed with interrupted plain, and the skin was closed in a subcuticular fashion with Vicryl on a Keith needle.  Benzoin and Steri-Strips, pressure dressing were applied.  All counts were correct.  The patient was taken to recovery room in stable condition.     Guy SandiferJames E. Henderson Cloudomblin, M.D.     JET/MEDQ  D:  07/29/2015  T:  07/29/2015  Job:  [id number]

## 2015-07-30 NOTE — Addendum Note (Signed)
Addendum  created 07/30/15 0802 by Junious SilkMelinda Denora Wysocki, CRNA   Modules edited: Clinical Notes   Clinical Notes:  File: [id number]

## 2015-07-30 NOTE — Plan of Care (Signed)
Problem: Life Cycle: Goal: Risk for postpartum hemorrhage will decrease Outcome: Progressing Pts bleeding remains appropriate and uterus is firm and midline. Sheryn BisonGordon, Shaily Librizzi Warner, RN

## 2015-07-30 NOTE — Progress Notes (Signed)
Pt unable to bend knees, Dr. Council Mechanicenenny notified and stated to give pt more time in PACU.

## 2015-07-30 NOTE — Plan of Care (Signed)
Problem: Bowel/Gastric: Goal: Gastrointestinal status will improve Outcome: Completed/Met Date Met:  07/30/15 Had BM prior to going to surgery.

## 2015-07-30 NOTE — Anesthesia Postprocedure Evaluation (Signed)
Anesthesia Post Note  Patient: Roberta Wood  Procedure(s) Performed: Procedure(s) (LRB): CESAREAN SECTION (N/A)  Patient location during evaluation: PACU Anesthesia Type: Spinal Level of consciousness: oriented and awake and alert Pain management: pain level controlled Vital Signs Assessment: post-procedure vital signs reviewed and stable Respiratory status: spontaneous breathing, respiratory function stable and patient connected to nasal cannula oxygen Cardiovascular status: blood pressure returned to baseline and stable Postop Assessment: no headache, no backache, spinal receding and patient able to bend at knees Anesthetic complications: no     Last Vitals:  Filed Vitals:   07/30/15 0215 07/30/15 0300  BP: 101/63   Pulse: 63 66  Temp: 37.3 C   Resp: 18 16    Last Pain:  Filed Vitals:   07/30/15 0330  PainSc: 3    Pain Goal: Patients Stated Pain Goal: 3 (07/30/15 0215)               [name]

## 2015-07-30 NOTE — Anesthesia Postprocedure Evaluation (Signed)
Anesthesia Post Note  Patient: Marian Sorrowshley M Kostelecky  Procedure(s) Performed: Procedure(s) (LRB): CESAREAN SECTION (N/A)  Patient location during evaluation: [facility] Anesthesia Type: Spinal Level of consciousness: oriented and awake and alert Pain management: pain level controlled Vital Signs Assessment: post-procedure vital signs reviewed and stable Respiratory status: spontaneous breathing and respiratory function stable Cardiovascular status: blood pressure returned to baseline and stable Postop Assessment: no headache and no backache Anesthetic complications: no     Last Vitals:  Filed Vitals:   07/30/15 0515 07/30/15 0608  BP:  103/70  Pulse: 62 59  Temp:  36.6 C  Resp: 16 16    Last Pain:  Filed Vitals:   07/30/15 0639  PainSc: 3    Pain Goal: Patients Stated Pain Goal: 3 (07/30/15 [id number])               Junious SilkGILBERT,Tobi Groesbeck

## 2015-07-30 NOTE — Progress Notes (Signed)
Pt passed golfball sized clot when she got up to the toilet for the first time. She was unsuccessful voiding, but passed some blood with the clot. Pt was assisted back to bed. Fundus massaged, no gushing or trickling of blood. Fundus was firm. Will continue to monitor. Sheryn BisonGordon, Rio Kidane Warner

## 2015-07-30 NOTE — Lactation Note (Signed)
This note was copied from a baby's chart. Lactation Consultation Note  Patient Name: Roberta Wood RUEAV'WToday's Date: 07/30/2015 Reason for consult: Initial assessment;NICU baby;Infant < 6lbs   Initial consult with mom of 11 hour old NICU infant born at 4144w3d GA. Mom reports she is pumping about every 3 hours, she has a DEBP set up in room. She reports she tried to pump with her 2.34 yo that was also in NICU.  Enc mom to pump every 2-3 hours during day and take a 5-6 hour stretch at night to rest followed by hand expression Mom demonstrated she knows technique for hand expression, no colostrum visible. Mom with small breast and small nipples. Changed flanges to #21 flanges for pumping.   Gave mom Providing Breast milk for Your Baby in NICU. Reviewed pumping and milk storage for NICU Infant. Breastfeeding Resource Handout given.   [facility] given, informed mom of IP/OP Services, BF Support Groups and LC phone #. Enc mom to call with questions/concerns prn. Follow up prn.   Maternal Data Has patient been taught Hand Expression?: Yes Does the patient have breastfeeding experience prior to this delivery?: Yes  Feeding Feeding Type: Formula Length of feed: 30 min  LATCH Score/Interventions                      Lactation Tools Discussed/Used WIC Program: No Pump Review: Setup, frequency, and cleaning;Milk Storage (NICU Milk Storage)   Consult Status Consult Status: Follow-up Date: 07/31/15 Follow-up type: In-patient    Silas FloodSharon S Hice 07/30/2015, 2:06 PM

## 2015-07-31 NOTE — Progress Notes (Signed)
No complaints  BP 98/63 mmHg  Pulse 63  Temp(Src) 98.6 F (37 C) (Oral)  Resp 15  Ht 5\' 5"  (1.651 m)  Wt 66.225 kg (146 lb)  BMI 24.30 kg/m2  SpO2 98%  Breastfeeding? Unknown No results found for this or any previous visit (from the past 24 hour(s)). Abdomen is soft and non tender  POD #2 Doing well Routine care Discharge home tomorrow

## 2015-07-31 NOTE — Lactation Note (Signed)
This note was copied from a baby's chart. Lactation Consultation Note  Patient Name: Girl Wendall Stadeshley Marriott OZHYQ'MToday's Date: 07/31/2015 Reason for consult: Follow-up assessment;NICU baby  NICU baby 1540 hours old, 4051w5d CGA. Mom reports that the baby is starting to suck more and mom may call for assistance with latching. Enc mom to continue offering STS and nuzzling/latching at breast as baby able. Mom reports that she has been pumping every 2-3 hours, but is not getting much colostrum so far. Discussed progression of milk coming to volume. Mom reports that her first child was in the NICU and although she pumped, she never did have a lot of breast milk. Mom reports that she is comfort with hand expression. Enc mom to call for assistance as needed.  Maternal Data    Feeding    LATCH Score/Interventions                      Lactation Tools Discussed/Used     Consult Status Consult Status: Follow-up Date: 08/01/15 Follow-up type: In-patient    Geralynn OchsWILLIARD, Baran Kuhrt 07/31/2015, 3:17 PM

## 2015-08-01 MED ORDER — IBUPROFEN 600 MG PO TABS: 600.0000 mg | ORAL_TABLET | Freq: Four times a day (QID) | ORAL | Status: DC

## 2015-08-01 MED ORDER — OXYCODONE HCL 5 MG PO TABS: 5.0000 mg | ORAL_TABLET | ORAL | Status: DC | PRN

## 2015-08-01 NOTE — Discharge Summary (Signed)
Obstetric Discharge Summary Reason for Admission: PTL Prenatal Procedures: ultrasound Intrapartum Procedures: cesarean: low cervical, transverse Postpartum Procedures: none Complications-Operative and Postpartum: none HEMOGLOBIN  Date Value Ref Range Status  07/30/2015 11.3* 12.0 - 15.0 g/dL Final   HCT  Date Value Ref Range Status  07/30/2015 33.5* 36.0 - 46.0 % Final    Physical Exam:  General: alert Lochia: appropriate Uterine Fundus: firm Incision: healing well DVT Evaluation: No evidence of DVT seen on physical exam.  Discharge Diagn 37 week IUP, PTL, trans lie Discharge Information: Date: 08/01/2015 Activity: pelvic rest Diet: routine Medications: PNV, Ibuprofen and Percocet Condition: stable Instructions: refer to practice specific booklet Discharge to: home Follow-up Information    Follow up with Meriel PicaHOLLAND,Merlyn Bollen M, MD. Schedule an appointment as soon as possible for a visit in 1 week.   Specialty:  Obstetrics and Gynecology   Contact information:   [address] [place] [id number] [phone]       Newborn Data: Live born female  Birth Weight: 5 lb 2.9 oz (2350 g) APGAR: 8, 9  Home with NICU.  Meriel PicaHOLLAND,Velinda Wrobel M 08/01/2015, 8:39 AM

## 2015-08-01 NOTE — Lactation Note (Signed)
This note was copied from a baby's chart. Lactation Consultation Note  Follow up visit.  Breasts are soft but filling.  Reviewed engorgement treatment.  Mom obtaining small amounts of transitional milk.  No questions regarding pumping.  2 week pump rental completed.  Encouraged to call with concerns prn.  Patient Name: Roberta Wood MVHQI'OToday's Date: 08/01/2015     Maternal Data    Feeding Feeding Type: Breast Milk with Formula added Length of feed: 60 min  LATCH Score/Interventions                      Lactation Tools Discussed/Used     Consult Status      Huston FoleyMOULDEN, Earle Troiano S 08/01/2015, 11:13 AM

## 2015-08-01 NOTE — Progress Notes (Signed)
Pt verbalizes understanding of d/c instructions, medications, follow up appts, when to seek medical attention, & belongings policy. Pt was given a copy of d/c instructions and her prescription prior to leaving. Pt has no questions at this time, aside from requesting to see lactation prior to leaving to rent a pump, which was done. Pts IV was d/c without complications prior to leaving. Pts family member is with her and walking her out. Escorted to the main entrance accompanied by NT. Sheryn BisonGordon, Marshal Eskew Warner

## 2015-08-02 LAB — RPR: RPR Ser Ql: NONREACTIVE

## 2015-08-17 ENCOUNTER — Ambulatory Visit: Payer: Self-pay

## 2015-08-17 NOTE — Lactation Note (Signed)
This note was copied from a baby's chart. Lactation Consultation Note   Consult in the NICU with this mom and baby, now 282 weeks old, and 36 1/7 weeks CGA. In the past, the baby has been sleepy at the breast. I noted that the baby has a short, thin frenulum posterior under her tongue, and a high palate, and a lip frenulum that extends to the gum line. Due to this finding, I tried a 20 nipple shield on mom. [name] was able to stay latched and suckle with stimulation. I also added a 5 french feeding tube and syringe filled with EBM, and while the baby was at the breast, on the shield, I inserted the tube tip into the side of the baby's mouth, and she slowly took 5 ml's of milk, and tolerated this well. I showed mom how to apply and care for nipple shield. Mom said she felt this was the best [name] had done at the breast. I encouraged mom to use nipple shield daily, if possible for her and [name], and to call for lactation as needed.   Patient Name: Roberta Wood'UToday's Date: 08/17/2015     Maternal Data    Feeding Feeding Type: Breast Milk with Formula added Length of feed: 90 min  LATCH Score/Interventions                      Lactation Tools Discussed/Used     Consult Status      Alfred LevinsLee, Setsuko Robins Anne 08/17/2015, 4:40 PM

## 2015-08-18 ENCOUNTER — Ambulatory Visit: Payer: Self-pay

## 2015-08-18 NOTE — Lactation Note (Signed)
This note was copied from a baby's chart. Lactation Consultation Note  Patient Name: Roberta Wood UJWJX'BToday's Date: 08/18/2015 Reason for consult: Follow-up assessment;NICU baby Infant is 622 weeks old and 36 2/7 weeks CGA. Nurse called LC to help with latch. Dad was holding [facility] when [facility] & mom wanted to try BF even though baby was falling asleep. Mom stated she has been pumping 8 times in 24 hours and was getting 50-5275mL each time she pumped with the hospital pump but now has been getting ~2 oz when pumping with her personal pump from her previous pregnancy - pt wonders whether the suction has decreased on her personal pump as well & wonders whether she needs to rent the hospital grade pump again. Mom is using 20mm nipple shield; mom needed a little verbal assistance on applying the nipple shield but applied it correctly on the 2nd attempt. Mom tried latching baby on right breast in cross-cradle but baby would not wake up. Mom did some hand expressing into the nipple shield but baby still would not wake up. Mom stated that they will be back tomorrow and would like help with BF - encouraged mom to have her nurse call lactation ~1hr before she plans to try BF so they can hopefully come help her. Discussed pumps with mom & how sometimes hospital grade pumps are needed while babies are in the NICU/ pumping primarily but also suggested talking with her insurance to see if she can get a pump with this new baby & sometimes they will let parents pay a little more to up-grade to a hospital grade pump; also discussed how we could rent a [employer] pump if that is what they decide. Mom reports no other questions at this time.  Maternal Data    Feeding Feeding Type: Breast Fed Length of feed: 90 min  LATCH Score/Interventions Latch: Too sleepy or reluctant, no latch achieved, no sucking elicited. Intervention(s): Breast compression  Audible Swallowing: None Intervention(s): Hand expression  Type  of Nipple: Everted at rest and after stimulation  Comfort (Breast/Nipple): Soft / non-tender     Hold (Positioning): No assistance needed to correctly position infant at breast.  LATCH Score: 6  Lactation Tools Discussed/Used     Consult Status Consult Status: PRN Follow-up type: In-patient    Oneal GroutLaura C Mareta Chesnut 08/18/2015, 2:34 PM

## 2015-08-19 ENCOUNTER — Ambulatory Visit: Payer: Self-pay

## 2015-08-19 NOTE — Lactation Note (Signed)
This note was copied from a baby's chart. Lactation Consultation Note  Assisted mother with attaching [name] to a #24 NS.  Initially she was not interested but as the milk began to flow [name] suckled and swallowed well and there was milk in the NS when she detached.  Encouraged mother to pump at the bed side while her hormones were stimulated.  Support given to parents.  Patient Name: Roberta Wendall Stadeshley Aldrete UJWJX'BToday's Date: 08/19/2015 Reason for consult: Follow-up assessment   Maternal Data    Feeding Feeding Type: Breast Fed Length of feed: 7 min  LATCH Score/Interventions Latch: Repeated attempts needed to sustain latch, nipple held in mouth throughout feeding, stimulation needed to elicit sucking reflex.  Audible Swallowing: A few with stimulation  Type of Nipple: Everted at rest and after stimulation  Comfort (Breast/Nipple): Soft / non-tender     Hold (Positioning): Assistance needed to correctly position infant at breast and maintain latch.  LATCH Score: 7  Lactation Tools Discussed/Used Tools: Nipple Shields Nipple shield size: 24   Consult Status      Roberta Wood, Roberta Wood 08/19/2015, 3:08 PM

## 2015-08-24 ENCOUNTER — Ambulatory Visit: Payer: Self-pay

## 2015-08-24 NOTE — Lactation Note (Signed)
This note was copied from a baby's chart. Lactation Consultation Note  Mom requesting feeding assessment.  Mom has been attempting putting baby to breast once per day but baby has not been showing much interest.  Mom had baby latched using a 20 mm nipple shield when I arrived.  Baby latched well and nursing actively.  Mom would like to try another position other than cross cradle hold.  Baby came off breast and nipple shield full of milk.  Assisted with positioning baby in football hold on opposite breast.  Baby showing feeding cues and latched easily using shield.  Baby nursed for 20 minutes and then fell asleep.  Encouraged mom to use good breast massage and compression during feeding.  Mom having some discomfort with pumping.  Both nipples slightly reddened.  She was using a 21 mm flange and nipple was blanching so she changed to 24 mm but still having discomfort.  Nipple size is ample so instructed to try 27 mm.  Reviewed normal preterm feeding norm.  Encouraged follow up outpatient lactation appointment.  Patient Name: Roberta Wood ZOXWR'UToday's Date: 08/24/2015 Reason for consult: Follow-up assessment;NICU baby   Maternal Data    Feeding Feeding Type: Breast Fed Nipple Type: Dr. Levert FeinsteinBrowns Ultra Preemie Length of feed: 20 min  LATCH Score/Interventions Latch: Grasps breast easily, tongue down, lips flanged, rhythmical sucking. Intervention(s): Skin to skin;Waking techniques Intervention(s): Breast compression;Breast massage;Assist with latch;Adjust position  Audible Swallowing: Spontaneous and intermittent  Type of Nipple: Everted at rest and after stimulation  Comfort (Breast/Nipple): Soft / non-tender     Hold (Positioning): No assistance needed to correctly position infant at breast. Intervention(s): Breastfeeding basics reviewed;Support Pillows;Position options;Skin to skin  LATCH Score: 10  Lactation Tools Discussed/Used Tools: Nipple Shields Nipple shield size:  20   Consult Status Consult Status: PRN Follow-up type: In-patient    Huston FoleyMOULDEN, Roberta Gilmer S 08/24/2015, 12:28 PM

## 2015-08-28 ENCOUNTER — Ambulatory Visit: Payer: Self-pay

## 2015-08-28 NOTE — Lactation Note (Signed)
This note was copied from a baby's chart. Lactation Consultation Note  Patient Name: Roberta Wood UJWJX'BToday's Date: 08/28/2015   NICU baby 894 weeks old. Mom states that she has been using different sizes of flanges (#21, #24 and #27) d/t sore nipples and possible plugged duct on right breast--outer aspect, below underarm. This LC could not palpate a plugged duct. Mom reports that she is pumping every 2.5 - 3 hours. Mom's nipples appear excoriated/with slight scabbing. Assisted mom with a few minutes of pumping to determine proper flange size. Enc mom to use #24 flanges and thin bit of coconut oil on nipple or neck of flange to reduce friction while pumping. Enc mom to massage and hand express after pumping--paying special attention to area with plugged duct. Also enc using small tub of warm water to soak breast with plugged duct.   Assisted mom to latch baby to right breast in cross-cradle position, but baby would not latch in this position. So latched baby in football position, using #24 NS. Mom return-demonstrated application of NS and able to latch baby without assistance of this LC. Baby able to maintain a deep latch, suckling rhythmically with lips flanged and intermittent swallows noted. Colostrum noted in NS. Enc mom to support baby's head and use rolled blanket to support her hand with baby tucked in close to the breast. Mom reported increased comfort.  Maternal Data    Feeding    [facility]                      Lactation Tools Discussed/Used     Consult Status      Roberta Wood, Roberta Wood 08/28/2015, 4:38 PM

## 2015-09-01 ENCOUNTER — Ambulatory Visit: Payer: Self-pay

## 2015-09-01 NOTE — Lactation Note (Signed)
This note was copied from a baby's chart. Lactation Consultation Note  Patient Name: Roberta Wood: 09/01/2015  Mom called today reporting some breast pain bilateral and nipple pain bilateral. No nipple breakdown reported. Mom using #20 nipple shield to latch per her report. She has #24 but reports the #20 fits baby's mouth better, and she feels baby transfers milk better with #20.  Mom reports she had clogged milk duct last week but this has resolved. She did not set her alarm for pumping last night and did not pump as often and reports breasts more firm, but she also thinks her milk supply is decreasing. Mom denies any S/S of mastitis. Mom reports her breast did soften with baby recently BF. Mom using #24 flange to pump, has tried 27 flange reporting this did not yield as much milk and no more or less discomfort. Mom does not have her pump today. Advised Mom to be sure she pumps every 2-3 hours, set alarm for night. Apply warm compress prior to pumping, pump breasts to soften, apply ice prn, Motrin prn. Discussed starting Fenugreek 610 mg 3 tabs TID for support milk production. For nipple tenderness, apply EBM/coconut oil. Mom denies any S/S of yeast. Mom will bring her pump tomorrow when visiting baby and may call for LC to check flange if available.    Maternal Data    Feeding    LATCH Score/Interventions                      Lactation Tools Discussed/Used     Consult Status      Roberta Wood, Roberta Wood 09/01/2015, 5:18 PM

## 2015-09-04 ENCOUNTER — Ambulatory Visit: Payer: Self-pay

## 2015-09-04 NOTE — Lactation Note (Signed)
This note was copied from a baby's chart. Lactation Consultation Note  Patient Name: Roberta Wood's Date: 09/04/2015 Reason for consult: Follow-up assessment   With this mom of a NICU baby, now 805 weeks old, and 38 5/7 weeks CGA. 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 Mom states she is not sure if she will have any interventions done with the baby's  tongue. I told mom to look on Dr. Tretha SciaraMcMurtry's we site, [email or web address], for information on tongue ties, etc. I also suggested the mom come in for an o/p lactation consult, once the baby is discharged to home. Mom knows to call for lactation as needed.   Maternal Data    Feeding Feeding Type: Breast Fed  LATCH Score/Interventions Latch: Grasps breast easily, tongue down, lips flanged, rhythmical sucking. Intervention(s): Skin to skin;Waking techniques Intervention(s): Adjust position;Assist with latch  Audible Swallowing: Spontaneous and intermittent  Type of Nipple: Everted at rest and after stimulation  Comfort (Breast/Nipple): Soft / non-tender     Hold (Positioning): Assistance needed to correctly position infant at breast and maintain latch. Intervention(s): Breastfeeding basics reviewed;Support Pillows;Position options;Skin to skin  LATCH Score: 9  Lactation Tools Discussed/Used Tools: Nipple Shields Nipple shield size: 20   Consult Status Consult Status: PRN Follow-up type: In-patient (NICU)    Roberta Wood, Roberta Wood 09/04/2015, 5:30 PM

## 2015-09-06 ENCOUNTER — Inpatient Hospital Stay (HOSPITAL_COMMUNITY)
Admission: RE | Admit: 2015-09-06 | Payer: BLUE CROSS/BLUE SHIELD | Source: Ambulatory Visit | Admitting: Obstetrics & Gynecology

## 2015-09-06 ENCOUNTER — Encounter (HOSPITAL_COMMUNITY): Admission: RE | Payer: Self-pay | Source: Ambulatory Visit

## 2015-09-06 SURGERY — Surgical Case
Anesthesia: Regional

## 2020-05-14 ENCOUNTER — Other Ambulatory Visit: Payer: Self-pay | Admitting: Obstetrics and Gynecology

## 2020-05-14 DIAGNOSIS — N6310 Unspecified lump in the right breast, unspecified quadrant: Secondary | ICD-10-CM

## 2020-05-16 ENCOUNTER — Other Ambulatory Visit: Payer: Self-pay

## 2020-05-16 ENCOUNTER — Ambulatory Visit
Admission: RE | Admit: 2020-05-16 | Discharge: 2020-05-16 | Disposition: A | Discharge status: Home / Self Care | Payer: BC Managed Care – PPO | Source: Ambulatory Visit | Attending: Obstetrics and Gynecology | Admitting: Obstetrics and Gynecology

## 2020-05-16 DIAGNOSIS — N6310 Unspecified lump in the right breast, unspecified quadrant: Secondary | ICD-10-CM

## 2020-06-22 ENCOUNTER — Other Ambulatory Visit: Payer: Self-pay

## 2021-02-25 ENCOUNTER — Telehealth: Payer: Self-pay | Admitting: Pulmonary Disease

## 2021-02-26 ENCOUNTER — Encounter: Payer: Self-pay | Admitting: Pulmonary Disease

## 2021-02-26 ENCOUNTER — Institutional Professional Consult (permissible substitution): Payer: BC Managed Care – PPO | Admitting: Pulmonary Disease

## 2021-02-26 ENCOUNTER — Ambulatory Visit: Payer: BC Managed Care – PPO | Admitting: Pulmonary Disease

## 2021-02-26 ENCOUNTER — Other Ambulatory Visit: Payer: Self-pay

## 2021-02-26 VITALS — BP 112/70 | HR 74 | Ht 65.0 in | Wt 131.0 lb

## 2021-02-26 DIAGNOSIS — J4530 Mild persistent asthma, uncomplicated: Secondary | ICD-10-CM

## 2021-02-26 DIAGNOSIS — R0609 Other forms of dyspnea: Secondary | ICD-10-CM

## 2021-02-26 MED ORDER — FLUTICASONE FUROATE-VILANTEROL 200-25 MCG/ACT IN AEPB: 1.0000 | INHALATION_SPRAY | Freq: Every day | RESPIRATORY_TRACT | 6 refills | Status: DC

## 2021-02-26 MED ORDER — MONTELUKAST SODIUM 10 MG PO TABS
10.0000 mg | ORAL_TABLET | Freq: Every day | ORAL | 11 refills | Status: DC
Start: 2021-02-26 — End: 2021-12-16

## 2021-02-26 NOTE — Patient Instructions (Addendum)
Nice to meet you  I worry your recent symptoms represent asthma   I am glad the prednisone helped  I recommend using a daily inhaler to prevent symptoms resuming  Use Breo 1 puff daily - rinse mouth out after every use  If cost is too much, please ask the pharmacist what the preferred agent would be and let me know.  I can prescribe this once you tell me.  If they are not helpful, call the office and we will try to find out where the preferred agent would be.  We will get pulmonary function test without albuterol in the coming days or weeks to help further evaluate your symptoms and make sure were not missing something else  Return to clinic in 3 months or sooner as needed with Dr. Judeth Horn

## 2021-02-26 NOTE — Progress Notes (Signed)
@Patient  ID: , female    DOB: November 26, 1980, 40 y.o.   MRN: [id number]  Chief Complaint  Patient presents with   Consult    Referred by PCP for increased SOB that started about 2-3 weeks ago. Denies any increased coughing.     Referring provider: [id number], FNP  HPI:   40 y.o. woman whom we are seeing in consultation for evaluation of dyspnea on exertion.  PCP note x2 reviewed.  Patient with onset of dyspnea about 1 week with a week of Thanksgiving.  Severe shortness of breath, chest tightness prickly at night.  When lying down.  Was given albuterol inhaler 11/21.  This would help provide momentary relief with these episodes.  Also noticed onset of daytime shortness of breath, more dyspnea on exertion.  Previously not an issue.  Represented to PCP 11/28.  Was given antibiotics and steroids.  Now, symptoms essentially gone.  Breathing back to baseline, there are few activities that she knows a little more short of breath but certainly markedly improved from prior.  Chest x-ray obtained from PCP 11/28 result reviewed with report of clear lungs.  Notably, there is PCP note from 08/2020 which was treated for bronchitis.  He does endorse a longstanding history of seasonal allergies.  When she gets a virus, it often settles in her chest, leads to use of bronchitis and prolonged symptoms.  PMH: Seasonal allergies, headaches Surgical history: C-section Family history: Father with CAD, multiple family members with asthma including children Social history: Lives in [place], from [place], never smoker   [place] / Pulmonary Flowsheets:   ACT:  No flowsheet data found.  MMRC: No flowsheet data found.  Epworth:  No flowsheet data found.  Tests:   FENO:  No results found for: NITRICOXIDE  PFT: No flowsheet data found.  WALK:  No flowsheet data found.  Imaging: Personally reviewed and as discussed above No results found.  Lab  Results: Personally reviewed CBC    Component Value Date/Time   WBC 18.4 (H) 07/30/2015 0149   RBC 3.62 (L) 07/30/2015 0149   HGB 11.3 (L) 07/30/2015 0149   HCT 33.5 (L) 07/30/2015 0149   PLT 232 07/30/2015 0149   MCV 92.5 07/30/2015 0149   MCH 31.2 07/30/2015 0149   MCHC 33.7 07/30/2015 0149   RDW 13.5 07/30/2015 0149   LYMPHSABS 2.3 12/04/2012 1311   MONOABS 0.4 12/04/2012 1311   EOSABS 0.1 12/04/2012 1311   BASOSABS 0.0 12/04/2012 1311    BMET    Component Value Date/Time   NA 136 07/29/2015 2133   K 4.0 07/29/2015 2133   CL 104 07/29/2015 2133   CO2 24 07/29/2015 2133   GLUCOSE 105 (H) 07/29/2015 2133   BUN <5 (L) 07/29/2015 2133   CREATININE 0.49 07/29/2015 2133   CALCIUM 8.4 (L) 07/29/2015 2133   GFRNONAA >60 07/29/2015 2133   GFRAA >60 07/29/2015 2133    BNP No results found for: BNP  ProBNP No results found for: PROBNP  Specialty Problems   None  Allergies  Allergen Reactions   Penicillins Hives    Has patient had a PCN reaction causing immediate rash, facial/tongue/throat swelling, SOB or lightheadedness with hypotension: No Has patient had a PCN reaction causing severe rash involving mucus membranes or skin necrosis: No Has patient had a PCN reaction that required hospitalization No Has patient had a PCN reaction occurring within the last 10 years: No If all of the above answers are "NO", then may  proceed with Cephalosporin use.      Immunization History  Administered Date(s) Administered   Influenza,inj,Quad PF,6+ Mos 12/07/2012   Janssen (J&J) SARS-COV-2 Vaccination 06/24/2019   Tdap 11/28/2012    Past Medical History:  Diagnosis Date   Abnormal Pap smear    Headache(784.0)    PROM (premature rupture of membranes) 11/13/12    Tobacco History: Social History   Tobacco Use  Smoking Status Never  Smokeless Tobacco Not on file   Counseling given: Not Answered   Continue to not smoke  Outpatient Encounter Medications as of  02/26/2021  Medication Sig   albuterol (VENTOLIN HFA) 108 (90 Base) MCG/ACT inhaler Inhale 2 puffs into the lungs every 4 (four) hours as needed.   fluticasone furoate-vilanterol (BREO ELLIPTA) 200-25 MCG/ACT AEPB Inhale 1 puff into the lungs daily.   hydrOXYzine (ATARAX) 25 MG tablet Take by mouth.   montelukast (SINGULAIR) 10 MG tablet Take 1 tablet (10 mg total) by mouth at bedtime.   NIKKI 3-0.02 MG tablet Take 1 tablet by mouth daily.   SUMAtriptan (IMITREX) 50 MG tablet Take by mouth.   traZODone (DESYREL) 50 MG tablet trazodone 50 mg tablet  TAKE 1/2 TO 2 TABLETS BY MOUTH ONCE DAILY AT BEDTIME.   valACYclovir (VALTREX) 500 MG tablet valacyclovir 500 mg tablet  TAKE TWO TABLETS (1,000 MG DOSE) BY MOUTH DAILY.   [DISCONTINUED] ibuprofen (ADVIL,MOTRIN) 600 MG tablet Take 1 tablet (600 mg total) by mouth every 6 (six) hours.   [DISCONTINUED] oxyCODONE (OXY IR/ROXICODONE) 5 MG immediate release tablet Take 1 tablet (5 mg total) by mouth every 4 (four) hours as needed (pain scale 4-7).   [DISCONTINUED] Prenatal Vit-Fe Fumarate-FA (PRENATAL MULTIVITAMIN) TABS Take 1 tablet by mouth daily at 12 noon.   No facility-administered encounter medications on file as of 02/26/2021.     Review of Systems  Review of Systems  No chest pain with exertion.  No orthopnea or PND.  No lower extreme swelling.  Comprehensive review of systems otherwise negative.  Physical Exam  BP 112/70    Pulse 74    Ht 5\' 5"  (1.651 m)    Wt 131 lb (59.4 kg)    SpO2 100% Comment: on RA   BMI 21.80 kg/m   Wt Readings from Last 5 Encounters:  02/26/21 131 lb (59.4 kg)  07/28/15 146 lb (66.2 kg)  12/08/12 127 lb 9.6 oz (57.9 kg)  09/28/12 125 lb (56.7 kg)  06/06/11 124 lb (56.2 kg)    BMI Readings from Last 5 Encounters:  02/26/21 21.80 kg/m  07/28/15 24.30 kg/m  12/08/12 21.23 kg/m  09/28/12 20.80 kg/m  06/06/11 20.63 kg/m     Physical Exam General: Well-appearing, in no acute distress Eyes: EOMI,  icterus Neck: Supple, no JVP Pulmonary: Clear, normal work of breathing Cardiovascular: Regular rhythm, no murmur Abdomen: Nondistended, bowel sounds present MSK: No synovitis, no joint effusion Neuro: Normal gait, no weakness psych psych: Normal mood, full affect   Assessment & Plan:   DOE, chest tightness: Suspect activation of asthma given reported history of exercise intolerance, seasonal allergies, recurrent bronchitis.  Increase suspicion for asthma given total resolution after prednisone course recently.  Chest x-ray clear 01/2021.  PFTs for further evaluation.  Asthma: Clinical diagnosis setting of recurrent bronchitis, seasonal allergies, exercise intolerance.  Significant chest tightness, dyspnea on exertion 01/2021 relieved with prednisone.  Counseled the importance of mains inhaler to prevent future exacerbations.  High-dose Breo prescribed today.  Montelukast also prescribed today to help with  allergy symptoms and associated asthma.   Return in about 3 months (around 05/27/2021).   Karren Burly, MD 02/27/2021

## 2021-03-01 ENCOUNTER — Telehealth: Payer: Self-pay | Admitting: Pulmonary Disease

## 2021-03-01 NOTE — Telephone Encounter (Signed)
Lm x1 for patient.  

## 2021-03-05 NOTE — Telephone Encounter (Signed)
Called and spoke to pt. Pt states she wanted to clarify that she is ok taking the albuterol hfa since a new script wasn't sent to pharmacy. Advised pt that she wouldve been advised to stop taking this med if Dr. Judeth Horn believed it was unnecessary. Advised pt she is suspected to have asthma but PFTs will confirm. Pt states she is taking the Breo but there are times she takes the Albuterol hfa. Advised pt to only take the albuterol if SOB or wheezing. Will send to Dr. Judeth Horn as Lorain Childes.

## 2021-03-05 NOTE — Telephone Encounter (Signed)
Noted.  Will close encounter.  

## 2021-03-26 ENCOUNTER — Ambulatory Visit (INDEPENDENT_AMBULATORY_CARE_PROVIDER_SITE_OTHER): Payer: BC Managed Care – PPO | Admitting: Pulmonary Disease

## 2021-03-26 ENCOUNTER — Other Ambulatory Visit: Payer: Self-pay

## 2021-03-26 DIAGNOSIS — R0609 Other forms of dyspnea: Secondary | ICD-10-CM | POA: Diagnosis not present

## 2021-03-26 LAB — PULMONARY FUNCTION TEST
DL/VA % pred: 96 %
DL/VA: 4.31 ml/min/mmHg/L
DLCO cor % pred: 99 %
DLCO cor: 21.74 ml/min/mmHg
DLCO unc % pred: 99 %
DLCO unc: 21.74 ml/min/mmHg
FEF 25-75 Pre: 3.36 L/sec
FEF2575-%Pred-Pre: 106 %
FEV1-%Pred-Pre: 108 %
FEV1-Pre: 3.28 L
FEV1FVC-%Pred-Pre: 101 %
FEV6-%Pred-Pre: 106 %
FEV6-Pre: 3.89 L
FEV6FVC-%Pred-Pre: 101 %
FVC-%Pred-Pre: 105 %
FVC-Pre: 3.91 L
Pre FEV1/FVC ratio: 84 %
Pre FEV6/FVC Ratio: 100 %
RV % pred: 121 %
RV: 1.9 L
TLC % pred: 112 %
TLC: 5.68 L

## 2021-03-26 NOTE — Progress Notes (Signed)
PFT done today. 

## 2021-03-26 NOTE — Progress Notes (Signed)
Pulmonary function tests briefly reviewed today appear largely normal with subtle signs of small airways disease or asthma with gas trapping. Can you see if the Memory Dance is helping and let her know results of PFTs? Thanks!

## 2021-05-27 ENCOUNTER — Ambulatory Visit: Payer: BC Managed Care – PPO | Admitting: Pulmonary Disease

## 2021-05-27 ENCOUNTER — Encounter: Payer: Self-pay | Admitting: Pulmonary Disease

## 2021-05-27 ENCOUNTER — Other Ambulatory Visit: Payer: Self-pay

## 2021-05-27 VITALS — BP 120/68 | HR 88 | Temp 98.0°F | Ht 65.0 in | Wt 124.4 lb

## 2021-05-27 DIAGNOSIS — R21 Rash and other nonspecific skin eruption: Secondary | ICD-10-CM

## 2021-05-27 DIAGNOSIS — J4530 Mild persistent asthma, uncomplicated: Secondary | ICD-10-CM

## 2021-05-27 MED ORDER — BUDESONIDE-FORMOTEROL FUMARATE 160-4.5 MCG/ACT IN AERO: 2.0000 | INHALATION_SPRAY | Freq: Two times a day (BID) | RESPIRATORY_TRACT | 12 refills | Status: DC

## 2021-05-27 NOTE — Progress Notes (Signed)
@Patient  ID: , female    DOB: December 03, 1980, 41 y.o.   MRN: [id number]  Chief Complaint  Patient presents with   Follow-up    Follow up. Pt states she is unsure with how the medications are working or if she is just having side effects from medications     Referring provider: [id number], FNP  HPI:   41 y.o. woman whom we are seeing in consultation for evaluation of dyspnea on exertion.  PCP note x2 reviewed.  Returns for scheduled follow-up.  At last visit placed on Breo for history of recurrent bronchitis, asthma, chest tightness.  This is helped some.  She has used her albuterol inhaler maybe 5-6 times in the last 12 weeks.  Much better control than prior.  He does have hoarse voice.  Has had issues with thrush in the mouth status post clotrimazole troches that have helped.  She also has had scattered rashes.  On face, on legs.  No clear rhyme or reason.  No clear cause.  Discussed referral to allergy for the same reason.  HPI at initial visit: Patient with onset of dyspnea about 1 week with a week of Thanksgiving.  Severe shortness of breath, chest tightness prickly at night.  When lying down.  Was given albuterol inhaler 11/21.  This would help provide momentary relief with these episodes.  Also noticed onset of daytime shortness of breath, more dyspnea on exertion.  Previously not an issue.  Represented to PCP 11/28.  Was given antibiotics and steroids.  Now, symptoms essentially gone.  Breathing back to baseline, there are few activities that she knows a little more short of breath but certainly markedly improved from prior.  Chest x-ray obtained from PCP 11/28 result reviewed with report of clear lungs.  Notably, there is PCP note from 08/2020 which was treated for bronchitis.  He does endorse a longstanding history of seasonal allergies.  When she gets a virus, it often settles in her chest, leads to use of bronchitis and prolonged symptoms.  PMH: Seasonal  allergies, headaches Surgical history: C-section Family history: Father with CAD, multiple family members with asthma including children Social history: Lives in [place], from [place], never smoker   [place] / Pulmonary Flowsheets:   ACT:  No flowsheet data found.  MMRC: No flowsheet data found.  Epworth:  No flowsheet data found.  Tests:   FENO:  No results found for: NITRICOXIDE  PFT: PFT Results Latest Ref Rng & Units 03/26/2021  FVC-Pre L 3.91  FVC-Predicted Pre % 105  Pre FEV1/FVC % % 84  FEV1-Pre L 3.28  FEV1-Predicted Pre % 108  DLCO uncorrected ml/min/mmHg 21.74  DLCO UNC% % 99  DLCO corrected ml/min/mmHg 21.74  DLCO COR %Predicted % 99  DLVA Predicted % 96  TLC L 5.68  TLC % Predicted % 112  RV % Predicted % 121  Personally reviewed, normal spirometry, DLCO within normal limits, TLC within normal limits, gas trapping present  WALK:  No flowsheet data found.  Imaging: Personally reviewed and as discussed above No results found.  Lab Results: Personally reviewed CBC    Component Value Date/Time   WBC 18.4 (H) 07/30/2015 0149   RBC 3.62 (L) 07/30/2015 0149   HGB 11.3 (L) 07/30/2015 0149   HCT 33.5 (L) 07/30/2015 0149   PLT 232 07/30/2015 0149   MCV 92.5 07/30/2015 0149   MCH 31.2 07/30/2015 0149   MCHC 33.7 07/30/2015 0149   RDW 13.5 07/30/2015 0149  LYMPHSABS 2.3 12/04/2012 1311   MONOABS 0.4 12/04/2012 1311   EOSABS 0.1 12/04/2012 1311   BASOSABS 0.0 12/04/2012 1311    BMET    Component Value Date/Time   NA 136 07/29/2015 2133   K 4.0 07/29/2015 2133   CL 104 07/29/2015 2133   CO2 24 07/29/2015 2133   GLUCOSE 105 (H) 07/29/2015 2133   BUN <5 (L) 07/29/2015 2133   CREATININE 0.49 07/29/2015 2133   CALCIUM 8.4 (L) 07/29/2015 2133   GFRNONAA >60 07/29/2015 2133   GFRAA >60 07/29/2015 2133    BNP No results found for: BNP  ProBNP No results found for: PROBNP  Specialty Problems   None  Allergies   Allergen Reactions   Penicillins Hives    Has patient had a PCN reaction causing immediate rash, facial/tongue/throat swelling, SOB or lightheadedness with hypotension: No Has patient had a PCN reaction causing severe rash involving mucus membranes or skin necrosis: No Has patient had a PCN reaction that required hospitalization No Has patient had a PCN reaction occurring within the last 10 years: No If all of the above answers are "NO", then may proceed with Cephalosporin use.      Immunization History  Administered Date(s) Administered   Influenza,inj,Quad PF,6+ Mos 12/07/2012   Janssen (J&J) SARS-COV-2 Vaccination 06/24/2019   Tdap 11/28/2012    Past Medical History:  Diagnosis Date   Abnormal Pap smear    Headache(784.0)    PROM (premature rupture of membranes) 11/13/12    Tobacco History: Social History   Tobacco Use  Smoking Status Never  Smokeless Tobacco Not on file   Counseling given: Not Answered   Continue to not smoke  Outpatient Encounter Medications as of 05/27/2021  Medication Sig   albuterol (VENTOLIN HFA) 108 (90 Base) MCG/ACT inhaler Inhale 2 puffs into the lungs every 4 (four) hours as needed.   budesonide-formoterol (SYMBICORT) 160-4.5 MCG/ACT inhaler Inhale 2 puffs into the lungs 2 (two) times daily.   hydrOXYzine (ATARAX) 25 MG tablet Take by mouth.   montelukast (SINGULAIR) 10 MG tablet Take 1 tablet (10 mg total) by mouth at bedtime.   NIKKI 3-0.02 MG tablet Take 1 tablet by mouth daily.   SUMAtriptan (IMITREX) 50 MG tablet Take by mouth.   traZODone (DESYREL) 50 MG tablet trazodone 50 mg tablet  TAKE 1/2 TO 2 TABLETS BY MOUTH ONCE DAILY AT BEDTIME.   valACYclovir (VALTREX) 500 MG tablet valacyclovir 500 mg tablet  TAKE TWO TABLETS (1,000 MG DOSE) BY MOUTH DAILY.   [DISCONTINUED] fluticasone furoate-vilanterol (BREO ELLIPTA) 200-25 MCG/ACT AEPB Inhale 1 puff into the lungs daily.   No facility-administered encounter medications on file as of  05/27/2021.     Review of Systems  Review of Systems  N/a  Physical Exam  BP 120/68 (BP Location: Left Arm, Patient Position: Sitting, Cuff Size: Normal)    Pulse 88    Temp 98 F (36.7 C) (Oral)    Ht 5\' 5"  (1.651 m)    Wt 124 lb 6.4 oz (56.4 kg)    SpO2 99%    BMI 20.70 kg/m   Wt Readings from Last 5 Encounters:  05/27/21 124 lb 6.4 oz (56.4 kg)  02/26/21 131 lb (59.4 kg)  07/28/15 146 lb (66.2 kg)  12/08/12 127 lb 9.6 oz (57.9 kg)  09/28/12 125 lb (56.7 kg)    BMI Readings from Last 5 Encounters:  05/27/21 20.70 kg/m  02/26/21 21.80 kg/m  07/28/15 24.30 kg/m  12/08/12 21.23 kg/m  09/28/12  20.80 kg/m     Physical Exam General: Well-appearing, in no acute distress Eyes: EOMI, icterus Neck: Supple, no JVP Pulmonary: Clear, normal work of breathing Cardiovascular: Regular rhythm, no murmur MSK: No synovitis, no joint effusion Neuro: Normal gait, no weakness psych  psych: Normal mood, flat affect   Assessment & Plan:   DOE, chest tightness: Suspect activation of asthma given reported history of exercise intolerance, seasonal allergies, recurrent bronchitis.  Increase suspicion for asthma given total resolution after prednisone course recently.  Chest x-ray clear 01/2021.  PFTs with gas trapping further increases suspicion for asthma.  Asthma: Clinical diagnosis setting of recurrent bronchitis, seasonal allergies, exercise intolerance, gas trapping on PFTs.  Significant chest tightness, dyspnea on exertion 01/2021 relieved with prednisone.  Symptoms and frequency of symptoms improved on high-dose Breo and montelukast.  Some hoarse voice, thrush with Breo.  Stop Breo as suspect the higher incidence with DPI.  Start high-dose Symbicort 2 puffs twice a day.  Instructed to rinse mouth and brush teeth and rinse mouth again after every use.  Continue albuterol as needed.  Skin rash: Maculopapular on pictures on extremities.  Unclear cause.  Appears like contact dermatitis  but does not cover entire surface of skin that was contacted by fabric.  No other areas while wearing closed with same detergent etc.  Referral to allergy for further testing.  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  In the setting of stress.  Suspect related to this.  Will refer to sleep doctor for further evaluation.   Return in about 3 months (around 08/27/2021).   Karren Burly, MD 05/27/2021

## 2021-05-27 NOTE — Patient Instructions (Addendum)
Nice to see you again ? ?I think the Virgel Bouquet has helped some, we will make changes as below to help with some of the side effects ? ?New prescription for Symbicort, 2 puffs twice a day.  Rinse your mouth out after every use, then brush your teeth, then rinse again.  Lets see if this helps cut down on the thrush.  In addition, see if this helps with the voice changes. ? ?I sent a referral to the allergist  ? ?I will help arrange follow-up with a sleep doctor ? ?Return to clinic in 3 months or sooner as needed ? ? ? ? ?

## 2021-05-28 NOTE — Progress Notes (Signed)
I have made a follow up for this patient.  ?

## 2021-06-15 DIAGNOSIS — J40 Bronchitis, not specified as acute or chronic: Secondary | ICD-10-CM

## 2021-06-15 HISTORY — DX: Bronchitis, not specified as acute or chronic: J40

## 2021-06-24 ENCOUNTER — Ambulatory Visit: Payer: BC Managed Care – PPO | Admitting: Pulmonary Disease

## 2021-06-24 ENCOUNTER — Encounter: Payer: Self-pay | Admitting: Pulmonary Disease

## 2021-06-24 VITALS — BP 110/72 | HR 97 | Temp 98.4°F | Ht 65.0 in | Wt 122.6 lb

## 2021-06-24 DIAGNOSIS — G478 Other sleep disorders: Secondary | ICD-10-CM

## 2021-06-24 MED ORDER — DOXYCYCLINE HYCLATE 100 MG PO TABS: 100.0000 mg | ORAL_TABLET | Freq: Two times a day (BID) | ORAL | 0 refills | Status: DC

## 2021-06-24 MED ORDER — BECLOMETHASONE DIPROP HFA 80 MCG/ACT IN AERB: 2.0000 | INHALATION_SPRAY | Freq: Two times a day (BID) | RESPIRATORY_TRACT | 3 refills | Status: DC

## 2021-06-24 MED ORDER — FLUCONAZOLE 100 MG PO TABS: 100.0000 mg | ORAL_TABLET | Freq: Every day | ORAL | 0 refills | Status: DC

## 2021-06-24 NOTE — Progress Notes (Signed)
? ?      ?Roberta Wood    [id number]    05-Mar-1981 ? ?Primary Care Physician:Anderson, Sela Hua, FNP ? ?Referring Physician: Maudie Flakes, FNP ?[phone] ?[place],  [place] [address] ? ?Chief complaint:   ?History of sleep paralysis ? ?HPI: ? ?Last time she had sleep paralysis was about 2 months ago early February. ?Chronic history of insomnia which recently got better ? ?Some recurrence of symptoms following initiation of Symbicort ? ?Insomnia for which she currently uses trazodone and sometimes hydroxyzine, on 50 mg of trazodone ?She does not use it on a nightly basis ? ?Currently struggling with a bronchitis and Thrush ?Developed thrush despite rinsing mouth following use of Symbicort ? ?Occasional snoring, no dry mouth in the morning, she does suffer from headaches ?No choking during sleep ?No significant daytime sleepiness if she is at a good nights rest ?She is fatigued during the day if she does not get an adequate amount of sleep ? ?No family history of sleep apnea ? ?Okay okay ? ?Outpatient Encounter Medications as of 06/24/2021  ?Medication Sig  ? albuterol (VENTOLIN HFA) 108 (90 Base) MCG/ACT inhaler Inhale 2 puffs into the lungs every 4 (four) hours as needed.  ? beclomethasone (QVAR) 80 MCG/ACT inhaler Inhale 2 puffs into the lungs 2 (two) times daily.  ? doxycycline (VIBRA-TABS) 100 MG tablet Take 1 tablet (100 mg total) by mouth 2 (two) times daily.  ? ferrous sulfate 324 MG TBEC Take 324 mg by mouth daily.  ? fluconazole (DIFLUCAN) 100 MG tablet Take 1 tablet (100 mg total) by mouth daily.  ? hydrOXYzine (ATARAX) 25 MG tablet Take by mouth.  ? montelukast (SINGULAIR) 10 MG tablet Take 1 tablet (10 mg total) by mouth at bedtime.  ? NIKKI 3-0.02 MG tablet Take 1 tablet by mouth daily.  ? SUMAtriptan (IMITREX) 50 MG tablet Take by mouth.  ? traZODone (DESYREL) 50 MG tablet trazodone 50 mg tablet ? TAKE 1/2 TO 2 TABLETS BY MOUTH ONCE DAILY AT BEDTIME.  ? valACYclovir (VALTREX) 500  MG tablet valacyclovir 500 mg tablet ? TAKE TWO TABLETS (1,000 MG DOSE) BY MOUTH DAILY.  ? [DISCONTINUED] budesonide-formoterol (SYMBICORT) 160-4.5 MCG/ACT inhaler Inhale 2 puffs into the lungs 2 (two) times daily.  ? ?No facility-administered encounter medications on file as of 06/24/2021.  ? ? ?Allergies as of 06/24/2021 - Review Complete 06/24/2021  ?Allergen Reaction Noted  ? Penicillins Hives 06/06/2011  ? ? ?Past Medical History:  ?Diagnosis Date  ? Abnormal Pap smear   ? Headache(784.0)   ? PROM (premature rupture of membranes) 11/13/12  ? ? ?Past Surgical History:  ?Procedure Laterality Date  ? CESAREAN SECTION N/A 12/25/2012  ? Procedure: Primary CESAREAN SECTION of baby boy  at 2007 APGAR 7/8;  Surgeon: Zelphia Cairo, MD;  Location: [facility];  Service: Obstetrics;  Laterality: N/A;  ? CESAREAN SECTION N/A 07/29/2015  ? Procedure: CESAREAN SECTION;  Surgeon: Harold Hedge, MD;  Location: [facility];  Service: Obstetrics;  Laterality: N/A;  ? COLPOSCOPY    ? ENDOMETRIAL BIOPSY    ? WISDOM TOOTH EXTRACTION    ? ? ?Family History  ?Problem Relation Age of Onset  ? Heart disease Father   ? ? ?Social History  ? ?Socioeconomic History  ? Marital status: Married  ?  Spouse name: Not on file  ? Number of children: Not on file  ? Years of education: Not on file  ? Highest education level: Not on  file  ?Occupational History  ? Not on file  ?Tobacco Use  ? Smoking status: Never  ? Smokeless tobacco: Not on file  ?Substance and Sexual Activity  ? Alcohol use: Yes  ? Drug use: No  ? Sexual activity: Yes  ?Other Topics Concern  ? Not on file  ?Social History Narrative  ? Not on file  ? ?Social Determinants of Health  ? ?Financial Resource Strain: Not on file  ?Food Insecurity: Not on file  ?Transportation Needs: Not on file  ?Physical Activity: Not on file  ?Stress: Not on file  ?Social Connections: Not on file  ?Intimate Partner Violence: Not on file  ? ? ?Review of Systems  ?Constitutional:  Positive for fatigue.   ?Respiratory:  Positive for cough and shortness of breath.   ?Psychiatric/Behavioral:  Positive for sleep disturbance.   ? ?Vitals:  ? 06/24/21 1536  ?BP: 110/72  ?Pulse: 97  ?Temp: 98.4 ?F (36.9 ?C)  ?SpO2: 99%  ? ? ? ?Physical Exam ?Constitutional:   ?   Appearance: Normal appearance.  ?HENT:  ?   Head: Normocephalic.  ?   Mouth/Throat:  ?   Mouth: Mucous membranes are moist.  ?Cardiovascular:  ?   Rate and Rhythm: Normal rate and regular rhythm.  ?   Heart sounds: No murmur heard. ?  No friction rub.  ?Pulmonary:  ?   Effort: No respiratory distress.  ?   Breath sounds: No stridor. No wheezing or rhonchi.  ?Musculoskeletal:  ?   Cervical back: No rigidity or tenderness.  ?Neurological:  ?   Mental Status: She is alert.  ?Psychiatric:     ?   Mood and Affect: Mood normal.  ? ? ?Assessment:  ?Sleep paralysis ?-Last episode was early February ?-No symptoms suggesting sleep apnea, no symptoms suggesting narcolepsy ? ?Chronic insomnia ?-Symptoms currently responding to trazodone and occasional use of hydroxyzine ? ?Mild persistent asthma ?-Switch to Qvar ? ?Intolerance to Symbicort ?-Will switch to a steroid inhaler ?-Qvar will be tried ?-Try Qvar as needed ? ?Thrush ?-Course of Diflucan ? ?Bronchitis ?-Course of doxycycline ? ? ? ?Plan/Recommendations: ?Prescription for doxycycline ?Prescription for Diflucan ?Prescription for Qvar ?Discontinue Symbicort ? ?A sleep study can be considered if there is recurrence of the sleep paralysis ? ?Follow-up in about 3 months ? ?Encouraged to ensure rinsing after using inhaler ? ? ?Virl Diamond MD ?[facility] ?06/24/2021, 4:11 PM ? ?CC: Maudie Flakes, FNP ? ? ?

## 2021-06-24 NOTE — Patient Instructions (Addendum)
Prescription for Qvar sent in in place of Symbicort ?-Discontinue Symbicort ? ?Prescription for doxycycline for your bronchitis ? ?Prescription for diflucan for thrush ? ?Continue to try and rinse after inhaler use ? ?Call if there is any recurrence of the sleep paralysis, a sleep study may be justified at that time ? ?I will see you in about 3 months ? ?Graded exercises as tolerated ?

## 2021-08-23 ENCOUNTER — Ambulatory Visit: Payer: BC Managed Care – PPO | Admitting: Internal Medicine

## 2021-08-23 ENCOUNTER — Encounter: Payer: Self-pay | Admitting: Internal Medicine

## 2021-08-23 VITALS — BP 108/70 | HR 94 | Temp 99.0°F | Resp 16 | Ht 64.57 in | Wt 124.0 lb

## 2021-08-23 DIAGNOSIS — L500 Allergic urticaria: Secondary | ICD-10-CM

## 2021-08-23 DIAGNOSIS — J4530 Mild persistent asthma, uncomplicated: Secondary | ICD-10-CM

## 2021-08-23 DIAGNOSIS — L2389 Allergic contact dermatitis due to other agents: Secondary | ICD-10-CM

## 2021-08-23 DIAGNOSIS — J3089 Other allergic rhinitis: Secondary | ICD-10-CM | POA: Diagnosis not present

## 2021-08-23 DIAGNOSIS — K219 Gastro-esophageal reflux disease without esophagitis: Secondary | ICD-10-CM

## 2021-08-23 DIAGNOSIS — T7819XA Other adverse food reactions, not elsewhere classified, initial encounter: Secondary | ICD-10-CM

## 2021-08-23 DIAGNOSIS — L2084 Intrinsic (allergic) eczema: Secondary | ICD-10-CM

## 2021-08-23 DIAGNOSIS — T781XXA Other adverse food reactions, not elsewhere classified, initial encounter: Secondary | ICD-10-CM

## 2021-08-23 MED ORDER — FLUTICASONE PROPIONATE 50 MCG/ACT NA SUSP: 2.0000 | Freq: Every day | NASAL | 2 refills | Status: AC

## 2021-08-23 MED ORDER — LEVOCETIRIZINE DIHYDROCHLORIDE 5 MG PO TABS: 5.0000 mg | ORAL_TABLET | Freq: Every evening | ORAL | 1 refills | Status: AC

## 2021-08-23 MED ORDER — AZELASTINE HCL 0.1 % NA SOLN: 2.0000 | Freq: Two times a day (BID) | NASAL | 12 refills | Status: AC
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
## 2021-08-23 NOTE — Patient Instructions (Addendum)
Allergic rhinitis: Not well controlled  - Testing today showed intradermal's were positive to molds - Copy of test results provided.  - Avoidance measures provided. - Continue with: Singulair (montelukast) 10mg  daily and Flonase (fluticasone) two sprays per nostril daily - Start taking: [name] (levocetirizine) 5mg  tablet once daily, Flonase (fluticasone) two sprays per nostril daily, and Astelin (azelastine) 2 sprays per nostril 1-2 times daily as needed - You can use an extra dose of the antihistamine, if needed, for breakthrough symptoms.  - Consider nasal saline rinses 1-2 times daily to remove allergens from the nasal cavities as well as help with mucous clearance (this is especially helpful to do before the nasal sprays are given) - Consider allergy shots as a means of long-term control and can reduce lifetime use of medications  - Allergy shots "re-train" and "reset" the immune system to ignore environmental allergens and decrease the resulting immune response to those allergens (sneezing, itchy watery eyes, runny nose, nasal congestion, etc).    - Allergy shots improve symptoms in 75-85%  - Allergy shots are the only potential permanent and disease modifying option  - We can discuss more at the next appointment if the medications are not working for you.  Mild Persistent  Asthma: moderately well controlled  - Breathing test today showed: looked great! No inflammation in your lungs   PLAN: . - Daily controller medication(s):  Qvar 2 puffs twice daily  and Singulair 10mg  daily - Prior to physical activity: albuterol 2 puffs 10-15 minutes before physical activity. - Rescue medications: albuterol 4 puffs every 4-6 hours as needed - Changes during respiratory infections or worsening symptoms: Increase Qvar  to 4 puffs twice daily for TWO WEEKS. - Get Influenza Vaccine and appropriate Pneumonia and COVID 19 boosters  - Asthma control goals:  * Full participation in all desired activities (may  need albuterol before activity) * Albuterol use two time or less a week on average (not counting use with activity) * Cough interfering with sleep two time or less a month * Oral steroids no more than once a year * No hospitalizations  Chronic Urticaria  - this is defined as hives lasting more than 6 weeks without an identifiable trigger - hives can be from a number of different sources including infections, allergies, vibration, temperature, pressure among many others other possible causes - often an identifiable cause is not determined - some potential triggers include: stress, illness, NSAIDs, aspirin, hormonal changes - you do not have any red flag symptoms to make concerned about secondary causes of hives, but we will screen for these for reassurance with: CBC w diff, CMP, tryptase, TSH, hive panel, alpha-gal panel, inflammatory markers - approximately 50% of patients with chronic hives can have some associated swelling of the face/lips/eyelids (this is not a cause for alarm and does not typically progress onto systemic allergic reactions) Your pictures are consistent with hives/urticaria  No clear allergic trigger based on history, we will go ahead and treat with [name]/levocetirizine 5 mg daily  You can increase this to twice a day as needed to control hives   Oral Allergy Syndrome: apple  -Testing was negative to apple, although your birch pollen was negative to still think symptoms with apple or oral allergy syndrome and not an apple allergy  - Your symptoms are not consistent with true food allergies, and are more likely to be due to oral allergy syndrome. - These symptoms are not life-threatening and are because of a cross reaction between  a pollen you are allergic to, and to a protein in specific foods (such as fresh fruits, vegetables, and nuts). - If you can eat these things it is fine to continue to do so.  If not, you may avoid these fresh fruits.  Heating these foods should  allow them to be consumed without symptoms. - You may notice increase in symptoms during allergy season, this is to be expected. - Allergy  Immunotherapy can help lessen and some cases cure these symptoms and should be considered if they worsen.   Atopic Dermatitis: well controlled  Daily Care For Maintenance (daily and continue even once eczema controlled) - Recommend hypoallergenic hydrating ointment at least twice daily.  This must be done daily for control of flares. (Great options include Vaseline, CeraVe, Aquaphor)  - Recommend avoiding detergents, soaps or lotions with fragrances/dyes, and instead using products which are hypoallergenic, use second rinse cycle when washing clothes -Wear lose breathable clothing, avoid wool -Avoid extremes of humidity - Limit showers/baths to 5 minutes and use luke warm water instead of hot, pat dry following baths, and apply moisturizer   Contact Dermatitis  - Continue to avoid nickel containing products.  If your rashes don't respond to urticaria treatment we can consider a low nickel diet (although I think this is unlikely to contributing to your rashes as this point)   Follow up: 2 months   Thank you so much for letting me partake in your care today.  Don't hesitate to reach out if you have any additional concerns!  Ferol Luz, MD  [facility] [place], [place]    Control of Mold Allergen   Mold and fungi can grow on a variety of surfaces provided certain temperature and moisture conditions exist.  Outdoor molds grow on plants, decaying vegetation and soil.  The major outdoor mold, Alternaria and Cladosporium, are found in very high numbers during hot and dry conditions.  Generally, a late Summer - Fall peak is seen for common outdoor fungal spores.  Rain will temporarily lower outdoor mold spore count, but counts rise rapidly when the rainy period ends.  The most important indoor molds are Aspergillus and Penicillium.  Dark,  humid and poorly ventilated basements are ideal sites for mold growth.  The next most common sites of mold growth are the bathroom and the kitchen.  Outdoor (Seasonal) Mold Control  Positive outdoor molds via skin testing: Bipolaris (Helminthsporium), Drechslera (Curvalaria), and Mucor  Use air conditioning and keep windows closed Avoid exposure to decaying vegetation. Avoid leaf raking. Avoid grain handling. Consider wearing a face mask if working in moldy areas.    Indoor (Perennial) Mold Control   Positive indoor molds via skin testing: Aspergillus, Penicillium, Fusarium, Aureobasidium (Pullulara), and Rhizopus  Maintain humidity below 50%. Clean washable surfaces with 5% bleach solution. Remove sources e.g. contaminated carpets.

## 2021-08-26 ENCOUNTER — Telehealth: Payer: Self-pay

## 2021-08-26 NOTE — Telephone Encounter (Signed)
We can still do allergy injections if she is on propranolol, we just need to be aware.  It does not increase the risk of allergic reactions to allergy injections, but can make them more difficult to treat.  It is not an absolute contraindication to starting however.  In regards to the mold allergy and penicillin there is no cross reaction and she can definitely still take penicillin antibiotics   Thanks!

## 2021-08-26 NOTE — Telephone Encounter (Signed)
Patient called in - DOB verified - asking the following the questions:  Is she able to do allergy injections since one of her medications is Propranolol?    Patient wanted to make sure of her Amoxicillin, Penicillin allergy due to Intradermal results done on 08/23/21 - Mold 2 - 2+. Is she okay to take antibiotics? Advised 'No' until approved by provider - explained how Drug challenges are done in office as well, can be further discussed w/provider @ next appt.  Patient advised message would be forwarded to provider - once provider responds, patient will be contacted.  Patient verbalized understanding, no further questions at this time.

## 2021-08-30 NOTE — Telephone Encounter (Signed)
Called patient - DPR verified - LMOVM advising of provider notation below. Advise to contact office if she any additional questions.

## 2021-09-30 ENCOUNTER — Encounter: Payer: Self-pay | Admitting: Pulmonary Disease

## 2021-09-30 ENCOUNTER — Ambulatory Visit: Payer: BC Managed Care – PPO | Admitting: Pulmonary Disease

## 2021-09-30 VITALS — BP 108/64 | HR 91 | Temp 98.4°F | Ht 64.5 in | Wt 123.8 lb

## 2021-09-30 DIAGNOSIS — G478 Other sleep disorders: Secondary | ICD-10-CM

## 2021-09-30 MED ORDER — ALBUTEROL SULFATE HFA 108 (90 BASE) MCG/ACT IN AERS: 2.0000 | INHALATION_SPRAY | RESPIRATORY_TRACT | 1 refills | Status: AC | PRN

## 2021-09-30 NOTE — Patient Instructions (Signed)
Schedule for in lab polysomnogram  Continue Qvar  Continue medications for allergy control  Blood work to include immunoglobulin levels, CBC with differentials to be discussed with allergist  I will see you in about 3 months  Call with significant concerns

## 2021-09-30 NOTE — Progress Notes (Unsigned)
Roberta Wood    [id number]    1980/04/27  Primary Care Physician:Anderson, Sela Hua, FNP  Referring Physician: Maudie Flakes, FNP [address] 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 Has not had any significant episodes recently Chronic history of insomnia which recently got better Still reports poor quality sleep  Occasional headaches in the morning Occasional fatigue Waking up with choking episodes in the morning sometimes  Some episodes of difficulty exhalation noted at night  Struggling with allergies Urticaria -Undergoing work-up with allergist  Currently on Qvar-compliant  Insomnia for which she currently uses trazodone and sometimes hydroxyzine, on 50 mg of trazodone She does not use it on a nightly basis  Currently struggling with a bronchitis and Thrush Developed thrush despite rinsing mouth following use of Symbicort  Occasional snoring, no dry mouth in the morning, she does suffer from headaches No choking during sleep No significant daytime sleepiness if she is at a good nights rest She is fatigued during the day if she does not get an adequate amount of sleep  No family history of sleep apnea   Outpatient Encounter Medications as of 09/30/2021  Medication Sig   albuterol (VENTOLIN HFA) 108 (90 Base) MCG/ACT inhaler Inhale 2 puffs into the lungs every 4 (four) hours as needed.   azelastine (ASTELIN) 0.1 % nasal spray Place 2 sprays into both nostrils 2 (two) times daily. Use in each nostril as directed   beclomethasone (QVAR) 80 MCG/ACT inhaler Inhale 2 puffs into the lungs 2 (two) times daily.   ferrous sulfate 324 MG TBEC Take 324 mg by mouth 2 (two) times a week.   fluticasone (FLONASE) 50 MCG/ACT nasal spray Place 2 sprays into both nostrils daily.   hydrOXYzine (ATARAX) 25 MG tablet Take by mouth.    levocetirizine ([name]) 5 MG tablet Take 1 tablet (5 mg total) by mouth every evening.   Magnesium 400 MG CAPS Take 400 mg by mouth 2 (two) times a week.   montelukast (SINGULAIR) 10 MG tablet Take 1 tablet (10 mg total) by mouth at bedtime.   NIKKI 3-0.02 MG tablet Take 1 tablet by mouth daily.   OMEPRAZOLE PO Take by mouth daily.   ondansetron (ZOFRAN) 4 MG tablet Take by mouth.   SUMAtriptan (IMITREX) 50 MG tablet Take by mouth.   traZODone (DESYREL) 50 MG tablet trazodone 50 mg tablet  TAKE 1/2 TO 2 TABLETS BY MOUTH ONCE DAILY AT BEDTIME.   UNABLE TO FIND Apply topically. Tretinion cream 3x a week   valACYclovir (VALTREX) 500 MG tablet valacyclovir 500 mg tablet  TAKE TWO TABLETS (1,000 MG DOSE) BY MOUTH DAILY.   propranolol (INDERAL) 10 MG tablet Take by mouth.   [DISCONTINUED] cetirizine (ZYRTEC) 10 MG tablet Take 10 mg by mouth daily. (Patient not taking: Reported on 09/30/2021)   No facility-administered encounter medications on file as of 09/30/2021.    Allergies as of 09/30/2021 - Review Complete 09/30/2021  Allergen Reaction Noted   Nickel Rash 08/23/2021   Penicillins Hives 06/06/2011    Past Medical History:  Diagnosis Date   Abnormal Pap smear    Asthma    Bronchitis 06/2021   Eczema    Headache(784.0)    PROM (premature rupture of membranes) 11/13/2012    Past Surgical History:  Procedure Laterality Date   CESAREAN  SECTION N/A 12/25/2012   Procedure: Primary CESAREAN SECTION of baby boy  at 2007 APGAR 7/8;  Surgeon: Zelphia Cairo, MD;  Location: [facility];  Service: Obstetrics;  Laterality: N/A;   CESAREAN SECTION N/A 07/29/2015   Procedure: CESAREAN SECTION;  Surgeon: Harold Hedge, MD;  Location: [facility];  Service: Obstetrics;  Laterality: N/A;   COLPOSCOPY     ENDOMETRIAL BIOPSY     WISDOM TOOTH EXTRACTION      Family History  Problem Relation Age of Onset   Allergic rhinitis Mother    Allergic rhinitis Father    Eczema Father    Heart disease  Father    Emphysema Father    COPD Maternal Grandmother     Social History   Socioeconomic History   Marital status: Married    Spouse name: Not on file   Number of children: Not on file   Years of education: Not on file   Highest education level: Not on file  Occupational History   Not on file  Tobacco Use   Smoking status: Never    Passive exposure: Past   Smokeless tobacco: Never  Vaping Use   Vaping Use: Never used  Substance and Sexual Activity   Alcohol use: Yes    Comment: occ   Drug use: No   Sexual activity: Yes    Birth control/protection: Pill  Other Topics Concern   Not on file  Social History Narrative   Not on file   Social Determinants of Health   Financial Resource Strain: Not on file  Food Insecurity: Not on file  Transportation Needs: Not on file  Physical Activity: Not on file  Stress: Not on file  Social Connections: Not on file  Intimate Partner Violence: Not on file    Review of Systems  Constitutional:  Positive for fatigue.  Respiratory:  Positive for cough, choking and shortness of breath.   Psychiatric/Behavioral:  Positive for sleep disturbance.     Vitals:   09/30/21 1039  BP: 108/64  Pulse: 91  Temp: 98.4 F (36.9 C)  SpO2: 99%     Physical Exam Constitutional:      Appearance: Normal appearance.  HENT:     Head: Normocephalic.     Mouth/Throat:     Mouth: Mucous membranes are moist.  Cardiovascular:     Rate and Rhythm: Normal rate and regular rhythm.     Heart sounds: No murmur heard.    No friction rub.  Pulmonary:     Effort: No respiratory distress.     Breath sounds: No stridor. No wheezing or rhonchi.  Musculoskeletal:     Cervical back: No rigidity or tenderness.  Neurological:     Mental Status: She is alert.  Psychiatric:        Mood and Affect: Mood normal.    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

## 2021-10-01 ENCOUNTER — Encounter: Payer: Self-pay | Admitting: Pulmonary Disease

## 2021-10-25 ENCOUNTER — Other Ambulatory Visit: Payer: Self-pay | Admitting: Pulmonary Disease

## 2021-10-25 ENCOUNTER — Ambulatory Visit: Payer: BC Managed Care – PPO | Admitting: Internal Medicine

## 2021-10-25 ENCOUNTER — Encounter: Payer: Self-pay | Admitting: Internal Medicine

## 2021-10-25 VITALS — BP 122/80 | HR 71 | Temp 98.8°F | Resp 16 | Wt 124.4 lb

## 2021-10-25 DIAGNOSIS — J3089 Other allergic rhinitis: Secondary | ICD-10-CM

## 2021-10-25 DIAGNOSIS — J4530 Mild persistent asthma, uncomplicated: Secondary | ICD-10-CM

## 2021-10-25 DIAGNOSIS — L501 Idiopathic urticaria: Secondary | ICD-10-CM | POA: Diagnosis not present

## 2021-10-25 NOTE — Patient Instructions (Addendum)
Allergic rhinitis: slightly improved  - Previous testing 08/23/2021: intradermals 's were positive to molds - Continue avoidance measures  - Continue with: Singulair (montelukast) 10mg  daily, Flonase (fluticasone) two sprays per nostril daily, and Astelin (azelastine) 2 sprays per nostril 1-2 times daily as needed  Increase xyzal to 5mg  twice daily  - Will get screening immunoglobulins to rule out chronic infections   Mild Persistent  Asthma: moderately well controlled  - Breathing test today showed: looked great! No inflammation in your lungs  - no concern for thrush today on exam   PLAN: . - Daily controller medication(s):  Qvar 2 puffs twice daily  and Singulair 10mg  daily - Prior to physical activity: albuterol 2 puffs 10-15 minutes before physical activity. - Rescue medications: albuterol 4 puffs every 4-6 hours as needed - Changes during respiratory infections or worsening symptoms: Increase Qvar  to 4 puffs twice daily for TWO WEEKS. - Get Influenza Vaccine and appropriate Pneumonia and COVID 19 boosters  - Asthma control goals:  * Full participation in all desired activities (may need albuterol before activity) * Albuterol use two time or less a week on average (not counting use with activity) * Cough interfering with sleep two time or less a month * Oral steroids no more than once a year * No hospitalizations  Chronic Urticaria  - Increase xyzal to 5mg  twice daily  - Will get screening labs for [facility]: tryptase, CU index   Oral Allergy Syndrome: apple  -previous testing 08/23/21: negative to apple,  - Continue avoid raw apples   Atopic Dermatitis: well controlled  Daily Care For Maintenance (daily and continue even once eczema controlled) - Recommend hypoallergenic hydrating ointment at least twice daily.  This must be done daily for control of flares. (Great options include Vaseline, CeraVe, Aquaphor)  - Recommend avoiding detergents, soaps or lotions with fragrances/dyes, and  instead using products which are hypoallergenic, use second rinse cycle when washing clothes -Wear lose breathable clothing, avoid wool -Avoid extremes of humidity - Limit showers/baths to 5 minutes and use luke warm water instead of hot, pat dry following baths, and apply moisturizer   Follow up: 4 weeks, we may consider starting omalizumab (xolair) at that time if hive are not well controlled   Thank you so much for letting me partake in your care today.  Don't hesitate to reach out if you have any additional concerns!  , MD  [facility] [place], [place]

## 2021-10-25 NOTE — Progress Notes (Signed)
Follow Up Note  RE: Roberta Wood MRN: [id number] DOB: 08/03/1980 Date of Office Visit: 10/25/2021  Referring provider: Maudie Flakes, FNP Primary care provider: Maudie Flakes, FNP  Chief Complaint: Follow-up (Still getting rashes and hives. Ears are popping constantly and hurt a little bit. Pulmonologist sent a note about running hemoglobins, and immunoglobins. Want to ask about thrush and the penicillin that was ordered for her.)  History of Present Illness: I had the pleasure of seeing Roberta Wood for a follow up visit at the [facility] on 10/25/2021. She is a 41 y.o. 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 Marlynn Perking. Today is a regular follow up visit.  History obtained from patient, chart review.  At 15-17 she took amoxicillin and developed diffuse urticaria and is concerned about cross reaction between penicillium mold.   ASTHMA - Medical therapy: Qvar 2 puffs twice daily  - Rescue inhaler use: a few uses  - Symptoms: rare cough, wheeze, dyspnea  - Exacerbation history: 0 ABX for respiratory illness since last visit, 0 OCS, 0ED, 0 UC visits in the past year  - ACT: 22 /25 - Adverse effects of medication: feels likes shes developed thrush  - Previous FEV1: 3.32 L, 106% - Biologic Labs not done -She is following with pulmonary for sleep paralysis and has a sleep study pending.  Pulmonologist recommended getting screening immunoglobulins given persistent rhinitis symptoms  Urticaria  - she is concerned about developing of MCAS: reports flushing with alcohol, chest tightness and headaches, she continues to have urticaria despite [name] 5 mg daily.  She is interested in further testing    Atopic Dermatitis  -  Well controlled, rarely needing topical steroids.     Contact Dermatitis   - Still avoiding nickel products, no flares   Assessment and Plan: Roberta Wood is a 41 y.o. female with: Mild persistent asthma without complication - Plan: Spirometry with Graph  Chronic idiopathic urticaria - Plan: Tryptase, Chronic Urticaria  Other allergic rhinitis - Plan: Immunoglobulins, QN, A/E/G/M Plan: Patient Instructions  Allergic rhinitis: slightly improved  - Previous testing 08/23/2021: intradermals 's were positive to molds - Continue avoidance measures  - Continue with: Singulair (montelukast) 10mg  daily, Flonase (fluticasone) two sprays per nostril daily, and Astelin (azelastine) 2 sprays per nostril 1-2 times daily as needed  Increase xyzal to 5mg  twice daily  - Will get screening immunoglobulins to rule out chronic infections   Mild Persistent  Asthma: moderately well controlled  - Breathing test today showed: looked great! No inflammation in your lungs  - no concern for thrush today on exam   PLAN: . - Daily controller medication(s):  Qvar 2 puffs twice daily  and Singulair 10mg  daily - Prior to physical activity: albuterol 2 puffs 10-15 minutes before physical activity. - Rescue medications: albuterol 4 puffs every 4-6 hours as needed - Changes during respiratory infections or worsening symptoms: Increase Qvar  to 4 puffs twice daily for TWO WEEKS. - Get Influenza Vaccine and appropriate Pneumonia and COVID 19 boosters  - Asthma control goals:  * Full participation in all desired activities (may need albuterol before activity) * Albuterol use two time or less a week on average (not counting use with activity) * Cough interfering with sleep two time or less a month * Oral steroids no more than once a year *  No hospitalizations  Chronic Urticaria  - Increase xyzal to 5mg  twice daily  - Will get screening labs for MCAS: tryptase, CU index   Oral Allergy Syndrome: apple  -previous testing 08/23/21: negative to apple,  - Continue avoid raw apples   Atopic  Dermatitis: well controlled  Daily Care For Maintenance (daily and continue even once eczema controlled) - Recommend hypoallergenic hydrating ointment at least twice daily.  This must be done daily for control of flares. (Great options include Vaseline, CeraVe, Aquaphor)  - Recommend avoiding detergents, soaps or lotions with fragrances/dyes, and instead using products which are hypoallergenic, use second rinse cycle when washing clothes -Wear lose breathable clothing, avoid wool -Avoid extremes of humidity - Limit showers/baths to 5 minutes and use luke warm water instead of hot, pat dry following baths, and apply moisturizer   Follow up: 4 weeks, we may consider starting omalizumab (xolair) at that time if hive are not well controlled   Thank you so much for letting me partake in your care today.  Don't hesitate to reach out if you have any additional concerns!  10/23/21, MD  [facility] [place] [place]         No follow-ups on file.  No orders of the defined types were placed in this encounter.   Lab Orders         Tryptase         Chronic Urticaria         Immunoglobulins, QN, A/E/G/M     Diagnostics: Spirometry:  Tracings reviewed. Her effort: Good reproducible efforts. FVC: 3.32 L FEV1: 2.89 L, 96% predicted FEV1/FVC ratio: 87% Interpretation: Spirometry consistent with normal pattern.  Please see scanned spirometry results for details.   Results interpreted by myself during this encounter and discussed with patient/family.   Medication List:  Current Outpatient Medications  Medication Sig Dispense Refill   albuterol (VENTOLIN HFA) 108 (90 Base) MCG/ACT inhaler Inhale 2 puffs into the lungs every 4 (four) hours as needed. 8 g 1   azelastine (ASTELIN) 0.1 % nasal spray Place 2 sprays into both nostrils 2 (two) times daily. Use in each nostril as directed 30 mL 12   beclomethasone (QVAR) 80 MCG/ACT inhaler Inhale 2 puffs into the lungs 2  (two) times daily. 1 each 3   ferrous sulfate 324 MG TBEC Take 324 mg by mouth 2 (two) times a week.     fluticasone (FLONASE) 50 MCG/ACT nasal spray Place 2 sprays into both nostrils daily. 16 g 2   hydrOXYzine (ATARAX) 25 MG tablet Take by mouth.     levocetirizine ([name]) 5 MG tablet Take 1 tablet (5 mg total) by mouth every evening. 90 tablet 1   Magnesium 400 MG CAPS Take 400 mg by mouth 2 (two) times a week.     montelukast (SINGULAIR) 10 MG tablet Take 1 tablet (10 mg total) by mouth at bedtime. 30 tablet 11   NIKKI 3-0.02 MG tablet Take 1 tablet by mouth daily.     OMEPRAZOLE PO Take by mouth daily.     ondansetron (ZOFRAN) 4 MG tablet Take by mouth.     SUMAtriptan (IMITREX) 50 MG tablet Take by mouth.     traZODone (DESYREL) 50 MG tablet trazodone 50 mg tablet  TAKE 1/2 TO 2 TABLETS BY MOUTH ONCE DAILY AT BEDTIME.     UNABLE TO FIND Apply topically. Tretinion cream 3x a week     valACYclovir (VALTREX) 500 MG tablet valacyclovir 500  mg tablet  TAKE TWO TABLETS (1,000 MG DOSE) BY MOUTH DAILY.     propranolol (INDERAL) 10 MG tablet Take by mouth.     No current facility-administered medications for this visit.   Allergies: Allergies  Allergen Reactions   Nickel Rash    Dermatologist tested   Penicillins Hives    Has patient had a PCN reaction causing immediate rash, facial/tongue/throat swelling, SOB or lightheadedness with hypotension: No Has patient had a PCN reaction causing severe rash involving mucus membranes or skin necrosis: No Has patient had a PCN reaction that required hospitalization No Has patient had a PCN reaction occurring within the last 10 years: No If all of the above answers are "NO", then may proceed with Cephalosporin use.     I reviewed her past medical history, social history, family history, and environmental history and no significant changes have been reported from her previous visit.  ROS: All others negative except as noted per HPI.    Objective: BP 122/80   Pulse 71   Temp 98.8 F (37.1 C) (Temporal)   Resp 16   Wt 124 lb 6.4 oz (56.4 kg)   SpO2 95%   BMI 21.02 kg/m  Body mass index is 21.02 kg/m. General Appearance:  Alert, cooperative, no distress, appears stated age  Head:  Normocephalic, without obvious abnormality, atraumatic  Eyes:  Conjunctiva clear, EOM's intact  Nose: Nares normal, hypertrophic turbinates, no visible anterior polyps, and septum midline  Throat: Lips, tongue normal; teeth and gums normal, normal posterior oropharynx and no tonsillar exudate  Neck: Supple, symmetrical  Lungs:   clear to auscultation bilaterally, Respirations unlabored, no coughing  Heart:  regular rate and rhythm and no murmur, Appears well perfused  Extremities: No edema  Skin: Skin color, texture, turgor normal, no rashes or lesions on visualized portions of skin   Neurologic: No gross deficits   Previous notes and tests were reviewed. The plan was reviewed with the patient/family, and all questions/concerned were addressed.  It was my pleasure to see Marcelina today and participate in her care. Please feel free to contact me with any questions or concerns.  Sincerely,  Ferol Luz, MD  Allergy & Immunology  [facility] Office: [phone]

## 2021-11-01 ENCOUNTER — Telehealth: Payer: Self-pay

## 2021-11-01 LAB — IMMUNOGLOBULINS A/E/G/M, SERUM
IgA/Immunoglobulin A, Serum: 236 mg/dL (ref 87–352)
IgE (Immunoglobulin E), Serum: 47 IU/mL (ref 6–495)
IgG (Immunoglobin G), Serum: 1202 mg/dL (ref 586–1602)
IgM (Immunoglobulin M), Srm: 136 mg/dL (ref 26–217)

## 2021-11-01 LAB — CHRONIC URTICARIA: cu index: 5 (ref <10)

## 2021-11-01 LAB — TRYPTASE: Tryptase: 4.3 ug/L (ref 2.2–13.2)

## 2021-11-01 NOTE — Telephone Encounter (Signed)
Patient called in asking about lab results. There were no result notes written. However, Dr. Marlynn Perking reviewed them in office while I was on the phone with the patient. Dr. Marlynn Perking stated that her labs were normal and patient was advised of this.

## 2021-11-01 NOTE — Progress Notes (Signed)
Blood work returned normal.  Patient already informed

## 2021-11-02 NOTE — Progress Notes (Signed)
I think that you meant to send this to Dr. Marlynn Perking.

## 2021-11-05 ENCOUNTER — Telehealth: Payer: Self-pay

## 2021-11-05 NOTE — Telephone Encounter (Signed)
Patient called in - DOB verified - wanted to know/make sure all of her labs were negative - verified/confirmed they were.   Patient verbalized understanding, no further questions.

## 2021-12-06 ENCOUNTER — Ambulatory Visit: Payer: BC Managed Care – PPO | Admitting: Internal Medicine

## 2021-12-09 NOTE — Patient Instructions (Signed)
Asthma Stop montelukast at this time Begin Dulera 200-2 puffs twice a day with a spacer to prevent cough or wheeze.  This will replace Qvar for now Continue albuterol 2 puffs once every 4 hours as needed for cough or wheeze You may use albuterol 2 puffs 5 to 15 minutes before activity to decrease cough or wheeze  Allergic rhinitis Continue allergen avoidance measures directed toward mold as listed below Continue [name] 5 mg once a day as needed for runny nose or itch.  He may take an additional dose of [name] 5 mg once a day if needed for breakthrough symptoms. Continue azelastine 2 sprays in each nostril up to twice a day as needed for runny nose Consider saline nasal rinses as needed for nasal symptoms. Use this before any medicated nasal sprays for best result  Hives (urticaria) Take the least amount of medications while remaining hive free Cetirizine (Zyrtec) 10mg  twice a day and famotidine (Pepcid) 20 mg twice a day. If no symptoms for 7-14 days then decrease to. Cetirizine (Zyrtec) 10mg  twice a day and famotidine (Pepcid) 20 mg once a day.  If no symptoms for 7-14 days then decrease to. Cetirizine (Zyrtec) 10mg  twice a day.  If no symptoms for 7-14 days then decrease to. Cetirizine (Zyrtec) 10mg  once a day. May use Benadryl (diphenhydramine) as needed for breakthrough hives       If symptoms return, then step up dosage Keep a detailed symptom journal including foods eaten, contact with allergens, medications taken, weather changes.  If your symptoms re-occur, begin a journal of events that occurred for up to 6 hours before your symptoms began including foods and beverages consumed, soaps or perfumes you had contact with, and medications.  A lab order has been placed to evaluate for alpha gal allergy.  We will call you when the test results become available Consider Xolair injections if your hives are not well managed with the treatment plan as listed above. Written information  provided.  Atopic dermatitis Continue a twice a day moisturizing routine Begin desonide 0.05% ointment to red and itchy areas up to twice a day as needed.  Do not use this medication for longer than 2 weeks in a row  Contact dermatitis Continue to avoid nickel and products containing nickel  Oral allergy syndrome Continue to avoid raw apples and apple peelings  Call the clinic if this treatment plan is not working well for you  Follow up in 1 month or sooner if needed.  Control of Mold Allergen Mold and fungi can grow on a variety of surfaces provided certain temperature and moisture conditions exist.  Outdoor molds grow on plants, decaying vegetation and soil.  The major outdoor mold, Alternaria and Cladosporium, are found in very high numbers during hot and dry conditions.  Generally, a late Summer - Fall peak is seen for common outdoor fungal spores.  Rain will temporarily lower outdoor mold spore count, but counts rise rapidly when the rainy period ends.  The most important indoor molds are Aspergillus and Penicillium.  Dark, humid and poorly ventilated basements are ideal sites for mold growth.  The next most common sites of mold growth are the bathroom and the kitchen.  Outdoor Use air conditioning and keep windows closed Avoid exposure to decaying vegetation. Avoid leaf raking. Avoid grain handling. Consider wearing a face mask if working in moldy areas.  Indoor Mold Control Maintain humidity below 50%. Clean washable surfaces with 5% bleach solution. Remove sources e.g. Contaminated  carpets.   The oral allergy syndrome (OAS) or pollen-food allergy syndrome (PFAS) is a relatively common form of food allergy, particularly in adults. It typically occurs in people who have pollen allergies when the immune system "sees" proteins on the food that look like proteins on the pollen. This results in the allergy antibody (IgE) binding to the food instead of the pollen.  Patients typically report itching and/or mild swelling of the mouth and throat immediately following ingestion of certain uncooked fruits (including nuts) or raw vegetables. Only a very small number of affected individuals experience systemic allergic reactions, such as anaphylaxis which occurs with true food allergies.

## 2021-12-09 NOTE — Progress Notes (Unsigned)
   [place] [address] Dept: [phone]  FOLLOW UP NOTE  Patient ID: Roberta Wood, female    DOB: January 28, 1981  Age: 41 y.o. MRN: [id number] Date of Office Visit: 12/10/2021  Assessment  Chief Complaint: No chief complaint on file.  HPI Roberta Wood is a 41 year old female who presents the clinic for follow-up visit.  She was last seen in this clinic on 10/25/2021 by [facility] for evaluation of asthma, allergic rhinitis, atopic dermatitis, urticaria, oral allergy syndrome directed toward apple, and contact dermatitis to nickel.  Her last environmental allergy skin testing was on 08/23/2021 and was positive to mold mix 2, mold mix 3, and mold mix 4 on intradermal testing.  She had food allergy testing to apple that was negative on 08/23/2021.   Drug Allergies:  Allergies  Allergen Reactions   Nickel Rash    Dermatologist tested   Penicillins Hives    Has patient had a PCN reaction causing immediate rash, facial/tongue/throat swelling, SOB or lightheadedness with hypotension: No Has patient had a PCN reaction causing severe rash involving mucus membranes or skin necrosis: No Has patient had a PCN reaction that required hospitalization No Has patient had a PCN reaction occurring within the last 10 years: No If all of the above answers are "NO", then may proceed with Cephalosporin use.      Physical Exam: There were no vitals taken for this visit.   Physical Exam  Diagnostics:    Assessment and Plan: No diagnosis found.  No orders of the defined types were placed in this encounter.   There are no Patient Instructions on file for this visit.  No follow-ups on file.    Thank you for the opportunity to care for this patient.  Please do not hesitate to contact me with questions.  Gareth Morgan, FNP [facility]

## 2021-12-10 ENCOUNTER — Ambulatory Visit (INDEPENDENT_AMBULATORY_CARE_PROVIDER_SITE_OTHER): Payer: BC Managed Care – PPO | Admitting: Family Medicine

## 2021-12-10 ENCOUNTER — Encounter: Payer: Self-pay | Admitting: Family Medicine

## 2021-12-10 VITALS — BP 108/66 | HR 85 | Temp 98.2°F

## 2021-12-10 DIAGNOSIS — L501 Idiopathic urticaria: Secondary | ICD-10-CM

## 2021-12-10 DIAGNOSIS — L253 Unspecified contact dermatitis due to other chemical products: Secondary | ICD-10-CM

## 2021-12-10 DIAGNOSIS — J4530 Mild persistent asthma, uncomplicated: Secondary | ICD-10-CM

## 2021-12-10 DIAGNOSIS — J3089 Other allergic rhinitis: Secondary | ICD-10-CM | POA: Diagnosis not present

## 2021-12-10 DIAGNOSIS — L2084 Intrinsic (allergic) eczema: Secondary | ICD-10-CM | POA: Diagnosis not present

## 2021-12-10 DIAGNOSIS — T781XXA Other adverse food reactions, not elsewhere classified, initial encounter: Secondary | ICD-10-CM

## 2021-12-10 DIAGNOSIS — T7819XA Other adverse food reactions, not elsewhere classified, initial encounter: Secondary | ICD-10-CM

## 2021-12-10 DIAGNOSIS — J4540 Moderate persistent asthma, uncomplicated: Secondary | ICD-10-CM | POA: Diagnosis not present

## 2021-12-10 MED ORDER — DESONIDE 0.05 % EX OINT: TOPICAL_OINTMENT | CUTANEOUS | 5 refills | Status: AC

## 2021-12-10 MED ORDER — DULERA 200-5 MCG/ACT IN AERO: 2.0000 | INHALATION_SPRAY | Freq: Two times a day (BID) | RESPIRATORY_TRACT | 5 refills | Status: DC

## 2021-12-11 ENCOUNTER — Encounter: Payer: Self-pay | Admitting: Family Medicine

## 2021-12-11 DIAGNOSIS — L253 Unspecified contact dermatitis due to other chemical products: Secondary | ICD-10-CM | POA: Insufficient documentation

## 2021-12-11 DIAGNOSIS — J4540 Moderate persistent asthma, uncomplicated: Secondary | ICD-10-CM | POA: Insufficient documentation

## 2021-12-11 DIAGNOSIS — J3089 Other allergic rhinitis: Secondary | ICD-10-CM | POA: Insufficient documentation

## 2021-12-11 DIAGNOSIS — L501 Idiopathic urticaria: Secondary | ICD-10-CM | POA: Insufficient documentation

## 2021-12-11 DIAGNOSIS — L2084 Intrinsic (allergic) eczema: Secondary | ICD-10-CM | POA: Insufficient documentation

## 2021-12-11 DIAGNOSIS — T781XXA Other adverse food reactions, not elsewhere classified, initial encounter: Secondary | ICD-10-CM | POA: Insufficient documentation

## 2021-12-11 LAB — CBC WITH DIFFERENTIAL
Basophils Absolute: 0 10*3/uL (ref 0.0–0.2)
Basos: 0 %
EOS (ABSOLUTE): 0.1 10*3/uL (ref 0.0–0.4)
Eos: 1 %
Hematocrit: 43.2 % (ref 34.0–46.6)
Hemoglobin: 14.3 g/dL (ref 11.1–15.9)
Immature Grans (Abs): 0 10*3/uL (ref 0.0–0.1)
Immature Granulocytes: 0 %
Lymphocytes Absolute: 2.5 10*3/uL (ref 0.7–3.1)
Lymphs: 26 %
MCH: 31.6 pg (ref 26.6–33.0)
MCHC: 33.1 g/dL (ref 31.5–35.7)
MCV: 95 fL (ref 79–97)
Monocytes Absolute: 0.5 10*3/uL (ref 0.1–0.9)
Monocytes: 5 %
Neutrophils Absolute: 6.5 10*3/uL (ref 1.4–7.0)
Neutrophils: 68 %
RBC: 4.53 x10E6/uL (ref 3.77–5.28)
RDW: 13.2 % (ref 11.7–15.4)
WBC: 9.7 10*3/uL (ref 3.4–10.8)

## 2021-12-12 LAB — ALPHA-GAL PANEL
Allergen Lamb IgE: <0.1 kU/L
Beef IgE: <0.1 kU/L
IgE (Immunoglobulin E), Serum: 44 IU/mL (ref 6–495)
O215-IgE Alpha-Gal: <0.1 kU/L
Pork IgE: <0.1 kU/L

## 2021-12-12 NOTE — Progress Notes (Signed)
Can you please let this patient know that her alpha gal panel was negative and her complete blood count was all normal. She does not need to avoid mammalian meats. Thank you

## 2021-12-16 ENCOUNTER — Encounter: Payer: Self-pay | Admitting: Pulmonary Disease

## 2021-12-16 ENCOUNTER — Ambulatory Visit (INDEPENDENT_AMBULATORY_CARE_PROVIDER_SITE_OTHER): Payer: BC Managed Care – PPO | Admitting: Pulmonary Disease

## 2021-12-16 VITALS — BP 104/64 | HR 87 | Ht 64.5 in | Wt 125.0 lb

## 2021-12-16 DIAGNOSIS — G478 Other sleep disorders: Secondary | ICD-10-CM

## 2021-12-16 NOTE — Progress Notes (Signed)
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  HPI:  Recent bronchitis, was switched from Qvar to [facility] which has helped symptoms a little bit better  We did talk about the difference between [facility] and Qvar today She may try to go back to Qvar if symptoms are well controlled  History of sleep paralysis, last episode in February Has not been told about any significant issues during the sleeping recently Breathing has not been erratic at night  History of insomnia which is better at present  Occasional headaches in the morning Occasional fatigue Waking up with choking episodes in the morning sometimes  Struggling with allergies Urticaria -Following up with allergist -Recently switched to [facility]  Insomnia for which she currently uses trazodone and sometimes hydroxyzine, on 50 mg of trazodone She does not use it on a nightly basis  Currently struggling with a bronchitis and Thrush Developed thrush despite rinsing mouth following use of Symbicort  Occasional snoring, no dry mouth in the morning, she does suffer from headaches No choking during sleep No significant daytime sleepiness if she is at a good nights rest She is fatigued during the day if she does not get an adequate amount of sleep  No family history of sleep apnea   Outpatient Encounter Medications as of 12/16/2021  Medication Sig   albuterol (VENTOLIN HFA) 108 (90 Base) MCG/ACT inhaler Inhale 2 puffs into the lungs every 4 (four) hours as needed.   azelastine (ASTELIN) 0.1 % nasal spray Place 2 sprays into both nostrils 2 (two) times daily. Use in each nostril as directed   desonide (DESOWEN) 0.05 % ointment Apply to red and itchy areas up to twice a day as needed.  Do not use this medication  for longer than 2 weeks in a row   ferrous sulfate 324 MG TBEC Take 324 mg by mouth 2 (two) times a week.   fluticasone (FLONASE) 50 MCG/ACT nasal spray Place 2 sprays into both nostrils daily.   hydrOXYzine (ATARAX) 25 MG tablet Take by mouth.   levocetirizine ([name]) 5 MG tablet Take 1 tablet (5 mg total) by mouth every evening.   Magnesium 400 MG CAPS Take 400 mg by mouth 2 (two) times a week.   mometasone-formoterol (DULERA) 200-5 MCG/ACT AERO Inhale 2 puffs into the lungs 2 (two) times daily.   NIKKI 3-0.02 MG tablet Take 1 tablet by mouth daily.   OMEPRAZOLE PO Take by mouth daily.   ondansetron (ZOFRAN) 4 MG tablet Take by mouth.   SUMAtriptan (IMITREX) 50 MG tablet Take by mouth.   traZODone (DESYREL) 50 MG tablet    UNABLE TO FIND Apply topically. Tretinion cream 3x a week   valACYclovir (VALTREX) 500 MG tablet valacyclovir 500 mg tablet  TAKE TWO TABLETS (1,000 MG DOSE) BY MOUTH DAILY.   propranolol (INDERAL) 10 MG tablet Take by mouth.   [DISCONTINUED] montelukast (SINGULAIR) 10 MG tablet Take 1 tablet (10 mg total) by mouth at bedtime. (Patient not taking: Reported on 12/16/2021)   No facility-administered encounter medications on file as of 12/16/2021.    Allergies as of 12/16/2021 - Review Complete 12/16/2021  Allergen Reaction Noted   Nickel Rash 08/23/2021   Penicillins Hives 06/06/2011    Past Medical History:  Diagnosis Date   Abnormal Pap smear    Asthma    Bronchitis 06/2021   Eczema    Headache(784.0)    PROM (premature rupture of membranes) 11/13/2012    Past Surgical History:  Procedure Laterality Date   CESAREAN SECTION N/A 12/25/2012   Procedure: Primary CESAREAN SECTION of baby boy  at 2007 APGAR 7/8;  Surgeon: Marylynn Pearson, MD;  Location: [facility];  Service: Obstetrics;  Laterality: N/A;   CESAREAN SECTION N/A 07/29/2015   Procedure: CESAREAN SECTION;  Surgeon: Everlene Farrier, MD;  Location: [facility];  Service: Obstetrics;  Laterality: N/A;    COLPOSCOPY     ENDOMETRIAL BIOPSY     WISDOM TOOTH EXTRACTION      Family History  Problem Relation Age of Onset   Allergic rhinitis Mother    Allergic rhinitis Father    Eczema Father    Heart disease Father    Emphysema Father    COPD Maternal Grandmother     Social History   Socioeconomic History   Marital status: Married    Spouse name: Not on file   Number of children: Not on file   Years of education: Not on file   Highest education level: Not on file  Occupational History   Not on file  Tobacco Use   Smoking status: Never    Passive exposure: Past   Smokeless tobacco: Never  Vaping Use   Vaping Use: Never used  Substance and Sexual Activity   Alcohol use: Yes    Comment: occ   Drug use: No   Sexual activity: Yes    Birth control/protection: Pill  Other Topics Concern   Not on file  Social History Narrative   Not on file   Social Determinants of Health   Financial Resource Strain: Not on file  Food Insecurity: Not on file  Transportation Needs: Not on file  Physical Activity: Not on file  Stress: Not on file  Social Connections: Not on file  Intimate Partner Violence: Not on file    Review of Systems  Constitutional:  Positive for fatigue.  Respiratory:  Positive for cough, choking and shortness of breath.   Psychiatric/Behavioral:  Positive for sleep disturbance.     Vitals:   12/16/21 1436  BP: 104/64  Pulse: 87  SpO2: 95%     Physical Exam Constitutional:      Appearance: Normal appearance.  HENT:     Head: Normocephalic.     Mouth/Throat:     Mouth: Mucous membranes are moist.  Cardiovascular:     Rate and Rhythm: Normal rate and regular rhythm.     Heart sounds: No murmur heard.    No friction rub.  Pulmonary:     Effort: No respiratory distress.     Breath sounds: No stridor. No wheezing or rhonchi.  Musculoskeletal:     Cervical back: No rigidity or tenderness.  Neurological:     Mental Status: She is alert.   Psychiatric:        Mood and Affect: Mood normal.     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

## 2021-12-16 NOTE — Patient Instructions (Signed)
Follow-up in 6 months  Continue Dulera, once you feel your symptoms are better, may try to go back to Qvar -I would say whatever it takes so that you are not using as much albuterol and your symptoms are best controlled is what you should be on  Continue to monitor symptoms -If you are having any significant sleep issues -Let us know and we can revisit the issue of having a sleep study done

## 2022-01-10 ENCOUNTER — Ambulatory Visit (INDEPENDENT_AMBULATORY_CARE_PROVIDER_SITE_OTHER): Payer: BC Managed Care – PPO | Admitting: Internal Medicine

## 2022-01-10 ENCOUNTER — Encounter: Payer: Self-pay | Admitting: Internal Medicine

## 2022-01-10 VITALS — BP 98/60 | HR 88 | Resp 16

## 2022-01-10 DIAGNOSIS — L501 Idiopathic urticaria: Secondary | ICD-10-CM

## 2022-01-10 DIAGNOSIS — L2389 Allergic contact dermatitis due to other agents: Secondary | ICD-10-CM | POA: Diagnosis not present

## 2022-01-10 DIAGNOSIS — J4540 Moderate persistent asthma, uncomplicated: Secondary | ICD-10-CM

## 2022-01-10 DIAGNOSIS — T781XXD Other adverse food reactions, not elsewhere classified, subsequent encounter: Secondary | ICD-10-CM

## 2022-01-10 DIAGNOSIS — J3089 Other allergic rhinitis: Secondary | ICD-10-CM | POA: Diagnosis not present

## 2022-01-10 MED ORDER — OMALIZUMAB 150 MG/ML ~~LOC~~ SOSY
300.0000 mg | PREFILLED_SYRINGE | SUBCUTANEOUS | Status: AC
  Administered 2022-01-10 – 2024-03-16 (×29): 300 mg via SUBCUTANEOUS

## 2022-01-10 MED ORDER — EPINEPHRINE 0.3 MG/0.3ML IJ SOAJ: 0.3000 mg | INTRAMUSCULAR | 1 refills | Status: DC | PRN

## 2022-01-10 NOTE — Patient Instructions (Addendum)
Asthma Continue Dulera 200-2 puffs twice a day with a spacer to prevent cough or wheeze.   Continue albuterol 2 puffs once every 4 hours as needed for cough or wheeze You may use albuterol 2 puffs 5 to 15 minutes before activity to decrease cough or wheeze  Allergic rhinitis Continue allergen avoidance measures directed toward mold as listed below Continue [name] 5 mg once a day as needed for runny nose or itch.  He may take an additional dose of [name] 5 mg once a day if needed for breakthrough symptoms. Continue azelastine 2 sprays in each nostril up to twice a day as needed for runny nose Consider saline nasal rinses as needed for nasal symptoms. Use this before any medicated nasal sprays for best result  Hives (urticaria) Take the least amount of medications while remaining hive free Cetirizine (Zyrtec) 10mg  twice a day and famotidine (Pepcid) 20 mg twice a day. If no symptoms for 7-14 days then decrease to. Cetirizine (Zyrtec) 10mg  twice a day and famotidine (Pepcid) 20 mg once a day.  If no symptoms for 7-14 days then decrease to. Cetirizine (Zyrtec) 10mg  twice a day.  If no symptoms for 7-14 days then decrease to. Cetirizine (Zyrtec) 10mg  once a day. May use Benadryl (diphenhydramine) as needed for breakthrough hives       If symptoms return, then step up dosage Start xolair 300mg  SQ every 4 weeks, first injectio given with samples today.  Consent obtained in clinic today.   Our biologic coordinator will reach out to you in regards to further approval.   We can consider MCAS work up if no reponse to [employer] (although this really doesn't change management)   Atopic dermatitis Continue a twice a day moisturizing routine Continue  desonide 0.05% ointment to red and itchy areas up to twice a day as needed.  Do not use this medication for longer than 2 weeks in a row  Contact dermatitis Continue to avoid nickel and products containing nickel  Oral allergy syndrome Continue to avoid raw  apples and apple peelings  Call the clinic if this treatment plan is not working well for you  Follow up: 3 months   Thank you so much for letting me partake in your care today.  Don't hesitate to reach out if you have any additional concerns!  [name], MD  [facility], [place]

## 2022-01-10 NOTE — Progress Notes (Unsigned)
Follow Up Note  RE: Roberta Wood MRN: [id number] DOB: 04-Jan-1981 Date of Office Visit: 01/10/2022  Referring provider: Maudie Flakes, FNP Primary care provider: Maudie Flakes, FNP  Chief Complaint: Urticaria, Asthma, and Allergic Rhinitis   History of Present Illness: I had the pleasure of seeing Roberta Wood for a follow up visit at the [facility] on 01/13/2022. She is a 41 y.o. female, who is being followed for asthma, chronic urticaria, atopic dermatitis, contact dermatitis, oral allergy syndrome, rhinitis. Her previous allergy office visit was on 12/10/2021 with [name], FNP. Today is a regular follow up visit.  History obtained from patient, chart review .  At last visit montelukast was stopped and she was switched from Qvar to [facility] 200 mcg.  Alpha-gal panel ordered which was negative.  ASTHMA - Medical therapy: Dulera 200 mcg 2 puffs twice daily - Rescue inhaler use: 1-2 times per week, dramatically decreased since starting Dulera - Symptoms: rare cough, wheeze, dyspnea  - Exacerbation history: 0 ABX for respiratory illness since last visit, 0 OCS, 0ED, 0 UC visits in the past year  - ACT: 22 /25 - Adverse effects of medication: feels likes shes developed thrush with inhaled corticosteroids, stop montelukast due to trend towards depression - Previous FEV1: 3.09L - Biologic Labs not done -She is following with pulmonary for sleep paralysis    Urticaria  - she is concerned about developing of MCAS: reports flushing with alcohol, chest tightness and headaches, she continues to have urticaria despite Zyrtec 10 mg in the a.m., [name] twice a day. er last environmental allergy skin testing was on 08/23/2021 and was positive to mold mix 2, mold mix 3, and mold mix 4 on intradermal testing.  Tryptase and chronic urticaria index were normal.  She does continue to have some episodes of diarrhea.     Atopic Dermatitis  -  Well controlled, rarely  needing topical steroids.  Uses Vaseline for emollient and has prescription topical steroids    Contact Dermatitis  - Still avoiding nickel products, no flares   Oral allergy syndrome -He will avoiding raw apples negative skin prick testing on 08/23/21 to apples  Allergic rhinitis -Some improvement in rhinorrhea and nasal congestion with the addition of Astelin.  Also on antihistamine regimen as per above for urticaria.  Symptoms are moderately well controlled  Assessment and Plan: Azana is a 41 y.o. female with: Moderate persistent asthma without complication  Other allergic rhinitis  Allergic contact dermatitis due to other agents  Idiopathic urticaria  Pollen-food allergy, subsequent encounter Plan: Patient Instructions  Asthma Continue Dulera 200-2 puffs twice a day with a spacer to prevent cough or wheeze.   Continue albuterol 2 puffs once every 4 hours as needed for cough or wheeze You may use albuterol 2 puffs 5 to 15 minutes before activity to decrease cough or wheeze  Allergic rhinitis Continue allergen avoidance measures directed toward mold as listed below Continue [name] 5 mg once a day as needed for runny nose or itch.  He may take an additional dose of [name] 5 mg once a day if needed for breakthrough symptoms. Continue azelastine 2 sprays in each nostril up to twice a day as needed for runny nose Consider saline nasal rinses as needed for nasal symptoms. Use this before any medicated nasal sprays for best result  Hives (urticaria) Take the least amount of medications while remaining hive free Cetirizine (Zyrtec) 10mg  twice a day and famotidine (Pepcid) 20  mg twice a day. If no symptoms for 7-14 days then decrease to. Cetirizine (Zyrtec) 10mg  twice a day and famotidine (Pepcid) 20 mg once a day.  If no symptoms for 7-14 days then decrease to. Cetirizine (Zyrtec) 10mg  twice a day.  If no symptoms for 7-14 days then decrease to. Cetirizine (Zyrtec) 10mg  once a  day. May use Benadryl (diphenhydramine) as needed for breakthrough hives       If symptoms return, then step up dosage Start xolair 300mg  SQ every 4 weeks, first injectio given with samples today.  Consent obtained in clinic today.   Our biologic coordinator will reach out to you in regards to further approval.   We can consider MCAS work up if no reponse to [employer] (although this really doesn't change management)   Atopic dermatitis Continue a twice a day moisturizing routine Continue  desonide 0.05% ointment to red and itchy areas up to twice a day as needed.  Do not use this medication for longer than 2 weeks in a row  Contact dermatitis Continue to avoid nickel and products containing nickel  Oral allergy syndrome Continue to avoid raw apples and apple peelings  Call the clinic if this treatment plan is not working well for you  Follow up: 3 months   Thank you so much for letting me partake in your care today.  Don't hesitate to reach out if you have any additional concerns!  [name], MD  [facility] [place], [place]      No follow-ups on file.  Meds ordered this encounter  Medications   EPINEPHrine 0.3 mg/0.3 mL IJ SOAJ injection    Sig: Inject 0.3 mg into the muscle as needed for anaphylaxis.    Dispense:  1 each    Refill:  1   omalizumab (XOLAIR) prefilled syringe 300 mg    Lab Orders  No laboratory test(s) ordered today   Diagnostics: Not done   Medication List:  Current Outpatient Medications  Medication Sig Dispense Refill   albuterol (VENTOLIN HFA) 108 (90 Base) MCG/ACT inhaler Inhale 2 puffs into the lungs every 4 (four) hours as needed. 8 g 1   azelastine (ASTELIN) 0.1 % nasal spray Place 2 sprays into both nostrils 2 (two) times daily. Use in each nostril as directed 30 mL 12   desonide (DESOWEN) 0.05 % ointment Apply to red and itchy areas up to twice a day as needed.  Do not use this medication for longer than 2 weeks in a row  15 g 5   EPINEPHrine 0.3 mg/0.3 mL IJ SOAJ injection Inject 0.3 mg into the muscle as needed for anaphylaxis. 1 each 1   ferrous sulfate 324 MG TBEC Take 324 mg by mouth 2 (two) times a week.     fluticasone (FLONASE) 50 MCG/ACT nasal spray Place 2 sprays into both nostrils daily. 16 g 2   hydrOXYzine (ATARAX) 25 MG tablet Take by mouth.     levocetirizine ([name]) 5 MG tablet Take 1 tablet (5 mg total) by mouth every evening. 90 tablet 1   Magnesium 400 MG CAPS Take 400 mg by mouth 2 (two) times a week.     mometasone-formoterol (DULERA) 200-5 MCG/ACT AERO Inhale 2 puffs into the lungs 2 (two) times daily. 13 g 5   NIKKI 3-0.02 MG tablet Take 1 tablet by mouth daily.     OMEPRAZOLE PO Take by mouth daily.     ondansetron (ZOFRAN) 4 MG tablet Take by mouth.  QVAR REDIHALER 80 MCG/ACT inhaler Inhale 2 puffs into the lungs 2 (two) times daily.     SUMAtriptan (IMITREX) 50 MG tablet Take by mouth.     traZODone (DESYREL) 50 MG tablet      UNABLE TO FIND Apply topically. Tretinion cream 3x a week     valACYclovir (VALTREX) 500 MG tablet valacyclovir 500 mg tablet  TAKE TWO TABLETS (1,000 MG DOSE) BY MOUTH DAILY.     propranolol (INDERAL) 10 MG tablet Take by mouth.     Current Facility-Administered Medications  Medication Dose Route Frequency Provider Last Rate Last Admin   omalizumab Geoffry Paradise) prefilled syringe 300 mg  300 mg Subcutaneous Q28 days Ferol Luz, MD   300 mg at 01/10/22 1507   Allergies: Allergies  Allergen Reactions   Nickel Rash    Dermatologist tested   Penicillins Hives    Has patient had a PCN reaction causing immediate rash, facial/tongue/throat swelling, SOB or lightheadedness with hypotension: No Has patient had a PCN reaction causing severe rash involving mucus membranes or skin necrosis: Yes Has patient had a PCN reaction that required hospitalization No Has patient had a PCN reaction occurring within the last 10 years: No If all of the above answers are  "NO", then may proceed with Cephalosporin use.     I reviewed her past medical history, social history, family history, and environmental history and no significant changes have been reported from her previous visit.  ROS: All others negative except as noted per HPI.   Objective: BP 98/60   Pulse 88   Resp 16   SpO2 99%  There is no height or weight on file to calculate BMI. General Appearance:  Alert, cooperative, no distress, appears stated age  Head:  Normocephalic, without obvious abnormality, atraumatic  Eyes:  Conjunctiva clear, EOM's intact  Nose: Nares normal,   Throat: Lips, tongue normal; teeth and gums normal,   Neck: Supple, symmetrical  Lungs:   clear to auscultation bilaterally, Respirations unlabored, no coughing  Heart:  regular rate and rhythm and no murmur, Appears well perfused  Extremities: No edema  Skin: Skin color, texture, turgor normal, no rashes or lesions on visualized portions of skin   Neurologic: No gross deficits   Previous notes and tests were reviewed. The plan was reviewed with the patient/family, and all questions/concerned were addressed.  It was my pleasure to see Keymiah today and participate in her care. Please feel free to contact me with any questions or concerns.  Sincerely,  Ferol Luz, MD  Allergy & Immunology  [facility] Office: [phone]

## 2022-01-14 ENCOUNTER — Telehealth: Payer: Self-pay | Admitting: *Deleted

## 2022-01-14 NOTE — Telephone Encounter (Signed)
-----   Message from Guy Franco, [place] sent at 01/10/2022  3:19 PM EDT ----- Regarding: Arvid Right Dr. Edison Pace started patient on Xolair 300 every 4 weeks for hives. Consent was signed and epi pen sent to pharmacy.

## 2022-01-14 NOTE — Telephone Encounter (Signed)
L/M for patient to contact me to advise approval, copay card and submit to [facility] for [place]

## 2022-01-15 ENCOUNTER — Other Ambulatory Visit (HOSPITAL_COMMUNITY): Payer: Self-pay

## 2022-01-15 MED ORDER — OMALIZUMAB 150 MG/ML ~~LOC~~ SOSY
300.0000 mg | PREFILLED_SYRINGE | SUBCUTANEOUS | 11 refills | Status: DC
  Filled 2022-01-15: qty 2, 28d supply, fill #0

## 2022-01-15 NOTE — Telephone Encounter (Signed)
Spoke to patient and advised approval, copay card and submit to [place] for [place]

## 2022-01-15 NOTE — Telephone Encounter (Signed)
Thanks

## 2022-01-15 NOTE — Telephone Encounter (Signed)
Called patient and advised of change in pharmacy from [place] to [employer]

## 2022-02-12 ENCOUNTER — Ambulatory Visit (INDEPENDENT_AMBULATORY_CARE_PROVIDER_SITE_OTHER): Payer: BC Managed Care – PPO

## 2022-02-12 ENCOUNTER — Other Ambulatory Visit: Payer: Self-pay | Admitting: Pulmonary Disease

## 2022-02-12 DIAGNOSIS — J4540 Moderate persistent asthma, uncomplicated: Secondary | ICD-10-CM | POA: Diagnosis not present

## 2022-02-12 NOTE — Telephone Encounter (Signed)
Pt needs office visit with Dr Judeth Horn

## 2022-03-13 ENCOUNTER — Ambulatory Visit (INDEPENDENT_AMBULATORY_CARE_PROVIDER_SITE_OTHER): Payer: BC Managed Care – PPO

## 2022-03-13 DIAGNOSIS — L501 Idiopathic urticaria: Secondary | ICD-10-CM

## 2022-03-28 ENCOUNTER — Encounter: Payer: Self-pay | Admitting: Internal Medicine

## 2022-03-28 ENCOUNTER — Ambulatory Visit: Payer: No Typology Code available for payment source | Admitting: Internal Medicine

## 2022-03-28 VITALS — BP 100/68 | HR 92 | Resp 16

## 2022-03-28 DIAGNOSIS — J3089 Other allergic rhinitis: Secondary | ICD-10-CM

## 2022-03-28 DIAGNOSIS — T781XXD Other adverse food reactions, not elsewhere classified, subsequent encounter: Secondary | ICD-10-CM

## 2022-03-28 DIAGNOSIS — L2084 Intrinsic (allergic) eczema: Secondary | ICD-10-CM

## 2022-03-28 DIAGNOSIS — L2389 Allergic contact dermatitis due to other agents: Secondary | ICD-10-CM

## 2022-03-28 DIAGNOSIS — J4540 Moderate persistent asthma, uncomplicated: Secondary | ICD-10-CM

## 2022-03-28 DIAGNOSIS — L501 Idiopathic urticaria: Secondary | ICD-10-CM

## 2022-03-28 NOTE — Progress Notes (Signed)
Follow Up Note  RE: Roberta Wood MRN: [id number] DOB: May 26, 1980 Date of Office Visit: 03/28/2022  Referring provider: Gregor Hams, FNP Primary care provider: Gregor Hams, FNP  Chief Complaint: Urticaria (/), Allergic Rhinitis , and Asthma  History of Present Illness: I had the pleasure of seeing Roberta Wood for a follow up visit at the [facility] [place] on 03/28/2022. She is a 42 y.o. female, who is being followed for urticaria asthma, dermatitis, . Her previous allergy office visit was on 01/10/22 with Dr. Edison Pace. Today is a regular follow up visit.  History obtained from patient, chart review.  Today she reports that her asthma is improved dramatically since starting Xolair.  She is using her Dulera 2 times a week.  She is only had to use albuterol once since last visit.  Biotics or steroids since last visit.  She is tolerating Xolair well.  She has noticed a dramatic decrease in urticaria hives which last minutes however she did have a couple hives on her lips which lasted approximately 24 to 48 hours.  She initially thought this was a cold sore and took her Valtrex.  And then she treated with Benadryl.  She has not had any recurrence of her dermatitis.  Rhinitis symptoms are well-controlled.  Denies any breakthrough runny stuffy nose sneezing, itchy watery eyes.  She continues to avoid raw apples.  Pertinent History/Diagnostics:  - Asthma: moderate persistent   - normal spirometry (08/23/21): ratio 89, 3.03L, 106%  - Allergic Rhinitis:   - SPT environmental panel (08/23/21): molds - Food Allergy (apples)  - Hx of reaction: oral allergy  - SPT select foods ( 08/23/21): negative to apple  - Urticaria   - xolair 300mg  every 4 weeks started 12/2021  - urticaria labs:  (10/2021) CU index, tryptase, alpha -gal, CBC were normal/negative -Dermatitis   -Patch testing: positive to nickel     Assessment and Plan: Roberta Wood is a 42 y.o. female  with: Idiopathic urticaria  Moderate persistent asthma without complication - Plan: Spirometry with Graph  Other allergic rhinitis  Intrinsic atopic dermatitis  Allergic contact dermatitis due to other agents  Pollen-food allergy, subsequent encounter Plan: Patient Instructions  Asthma Continue Dulera 200-2 puffs twice a day as needed with a spacer to prevent cough or wheeze.   Continue albuterol 2 puffs once every 4 hours as needed for cough or wheeze You may use albuterol 2 puffs 5 to 15 minutes before activity to decrease cough or wheeze  Allergic rhinitis Continue allergen avoidance measures directed toward mold as listed below Continue [name] 5 mg once a day as needed for runny nose or itch.   Continue azelastine 2 sprays in each nostril up to twice a day as needed for runny nose Consider saline nasal rinses as needed for nasal symptoms. Use this before any medicated nasal sprays for best result  Hives (urticaria) Take the least amount of medications while remaining hive free Continue zyzal 5mg  daily . Recommend extra zyzal for any breakthrough symptoms  Continue xolair 300mg  every 4 weeks  Atopic dermatitis Continue a twice a day moisturizing routine Continue  desonide 0.05% ointment to red and itchy areas up to twice a day as needed.  Do not use this medication for longer than 2 weeks in a row  Contact dermatitis Continue to avoid nickel and products containing nickel  Oral allergy syndrome Continue to avoid raw apples and apple peelings  Call the clinic if this treatment plan  is not working well for you  Follow up: 3 months   Thank you so much for letting me partake in your care today.  Don't hesitate to reach out if you have any additional concerns!  Ferol Luz, MD  [facility] [place], [place]     No follow-ups on file.  No orders of the defined types were placed in this encounter.   Lab Orders  No laboratory test(s) ordered today    Diagnostics: Spirometry:  Tracings reviewed. Her effort: Good reproducible efforts. FVC: 3.48L FEV1: 3.08L, 102% predicted FEV1/FVC ratio: 89% Interpretation: Spirometry consistent with normal pattern.  Please see scanned spirometry results for details.    Medication List:  Current Outpatient Medications  Medication Sig Dispense Refill   albuterol (VENTOLIN HFA) 108 (90 Base) MCG/ACT inhaler Inhale 2 puffs into the lungs every 4 (four) hours as needed. 8 g 1   azelastine (ASTELIN) 0.1 % nasal spray Place 2 sprays into both nostrils 2 (two) times daily. Use in each nostril as directed 30 mL 12   desonide (DESOWEN) 0.05 % ointment Apply to red and itchy areas up to twice a day as needed.  Do not use this medication for longer than 2 weeks in a row 15 g 5   EPINEPHrine 0.3 mg/0.3 mL IJ SOAJ injection Inject 0.3 mg into the muscle as needed for anaphylaxis. 1 each 1   ferrous sulfate 324 MG TBEC Take 324 mg by mouth 2 (two) times a week.     fluticasone (FLONASE) 50 MCG/ACT nasal spray Place 2 sprays into both nostrils daily. 16 g 2   hydrOXYzine (ATARAX) 25 MG tablet Take by mouth.     levocetirizine ([name]) 5 MG tablet Take 1 tablet (5 mg total) by mouth every evening. 90 tablet 1   Magnesium 400 MG CAPS Take 400 mg by mouth 2 (two) times a week.     mometasone-formoterol (DULERA) 200-5 MCG/ACT AERO Inhale 2 puffs into the lungs 2 (two) times daily. 13 g 5   NIKKI 3-0.02 MG tablet Take 1 tablet by mouth daily.     omalizumab Geoffry Paradise) 150 MG/ML prefilled syringe Inject 300 mg into the skin every 28 (twenty-eight) days. 2 mL 11   OMEPRAZOLE PO Take by mouth daily.     ondansetron (ZOFRAN) 4 MG tablet Take by mouth.     SUMAtriptan (IMITREX) 50 MG tablet Take by mouth.     traZODone (DESYREL) 50 MG tablet      UNABLE TO FIND Apply topically. Tretinion cream 3x a week     valACYclovir (VALTREX) 500 MG tablet valacyclovir 500 mg tablet  TAKE TWO TABLETS (1,000 MG DOSE) BY MOUTH DAILY.      propranolol (INDERAL) 10 MG tablet Take by mouth.     Current Facility-Administered Medications  Medication Dose Route Frequency Provider Last Rate Last Admin   omalizumab Geoffry Paradise) prefilled syringe 300 mg  300 mg Subcutaneous Q28 days Ferol Luz, MD   300 mg at 03/13/22 0841   Allergies: Allergies  Allergen Reactions   Nickel Rash    Dermatologist tested   Penicillins Hives    Has patient had a PCN reaction causing immediate rash, facial/tongue/throat swelling, SOB or lightheadedness with hypotension: No Has patient had a PCN reaction causing severe rash involving mucus membranes or skin necrosis: Yes Has patient had a PCN reaction that required hospitalization No Has patient had a PCN reaction occurring within the last 10 years: No If all of the above answers  are "NO", then may proceed with Cephalosporin use.     I reviewed her past medical history, social history, family history, and environmental history and no significant changes have been reported from her previous visit.  ROS: All others negative except as noted per HPI.   Objective: BP 100/68   Pulse 92   Resp 16   SpO2 99%  There is no height or weight on file to calculate BMI. General Appearance:  Alert, cooperative, no distress, appears stated age  Head:  Normocephalic, without obvious abnormality, atraumatic  Eyes:  Conjunctiva clear, EOM's intact  Nose: Nares normal,   Throat: Lips, tongue normal; teeth and gums normal,   Neck: Supple, symmetrical  Lungs:   clear to auscultation bilaterally, Respirations unlabored, no coughing  Heart:  regular rate and rhythm and no murmur, Appears well perfused  Extremities: No edema  Skin: Skin color, texture, turgor normal, no rashes or lesions on visualized portions of skin   Neurologic: No gross deficits   Previous notes and tests were reviewed. The plan was reviewed with the patient/family, and all questions/concerned were addressed.  It was my pleasure to see  Roberta Wood today and participate in her care. Please feel free to contact me with any questions or concerns.  Sincerely,  [name], MD  Allergy & Immunology  [facility] Office: [phone]

## 2022-03-28 NOTE — Patient Instructions (Addendum)
Asthma Continue Dulera 200-2 puffs twice a day as needed with a spacer to prevent cough or wheeze.   Continue albuterol 2 puffs once every 4 hours as needed for cough or wheeze You may use albuterol 2 puffs 5 to 15 minutes before activity to decrease cough or wheeze  Allergic rhinitis Continue allergen avoidance measures directed toward mold as listed below Continue [name] 5 mg once a day as needed for runny nose or itch.   Continue azelastine 2 sprays in each nostril up to twice a day as needed for runny nose Consider saline nasal rinses as needed for nasal symptoms. Use this before any medicated nasal sprays for best result  Hives (urticaria) Take the least amount of medications while remaining hive free Continue zyzal 5mg  daily . Recommend extra zyzal for any breakthrough symptoms  Continue xolair 300mg  every 4 weeks  Atopic dermatitis Continue a twice a day moisturizing routine Continue  desonide 0.05% ointment to red and itchy areas up to twice a day as needed.  Do not use this medication for longer than 2 weeks in a row  Contact dermatitis Continue to avoid nickel and products containing nickel  Oral allergy syndrome Continue to avoid raw apples and apple peelings  Call the clinic if this treatment plan is not working well for you  Follow up: 3 months   Thank you so much for letting me partake in your care today.  Don't hesitate to reach out if you have any additional concerns!  [name], MD  [facility] [place], [place]

## 2022-04-10 ENCOUNTER — Ambulatory Visit: Payer: BC Managed Care – PPO

## 2022-04-11 ENCOUNTER — Ambulatory Visit: Payer: BC Managed Care – PPO | Admitting: Internal Medicine

## 2022-04-29 ENCOUNTER — Ambulatory Visit: Payer: No Typology Code available for payment source

## 2022-05-08 ENCOUNTER — Ambulatory Visit (INDEPENDENT_AMBULATORY_CARE_PROVIDER_SITE_OTHER): Payer: No Typology Code available for payment source

## 2022-05-08 DIAGNOSIS — L501 Idiopathic urticaria: Secondary | ICD-10-CM

## 2022-06-04 ENCOUNTER — Ambulatory Visit: Payer: No Typology Code available for payment source

## 2022-06-04 DIAGNOSIS — L501 Idiopathic urticaria: Secondary | ICD-10-CM | POA: Diagnosis not present

## 2022-07-02 ENCOUNTER — Ambulatory Visit: Payer: No Typology Code available for payment source

## 2022-07-02 DIAGNOSIS — L501 Idiopathic urticaria: Secondary | ICD-10-CM | POA: Diagnosis not present

## 2022-07-04 ENCOUNTER — Ambulatory Visit: Payer: No Typology Code available for payment source | Admitting: Internal Medicine

## 2022-07-21 ENCOUNTER — Encounter: Payer: Self-pay | Admitting: Family Medicine

## 2022-07-22 ENCOUNTER — Other Ambulatory Visit: Payer: Self-pay | Admitting: Obstetrics & Gynecology

## 2022-07-22 DIAGNOSIS — Z1231 Encounter for screening mammogram for malignant neoplasm of breast: Secondary | ICD-10-CM

## 2022-07-30 ENCOUNTER — Ambulatory Visit: Payer: No Typology Code available for payment source | Admitting: *Deleted

## 2022-07-30 DIAGNOSIS — L501 Idiopathic urticaria: Secondary | ICD-10-CM

## 2022-08-15 ENCOUNTER — Other Ambulatory Visit: Payer: Self-pay

## 2022-08-15 ENCOUNTER — Ambulatory Visit: Payer: No Typology Code available for payment source | Admitting: Internal Medicine

## 2022-08-15 ENCOUNTER — Encounter: Payer: Self-pay | Admitting: Internal Medicine

## 2022-08-15 VITALS — BP 98/74 | HR 90 | Temp 98.9°F | Ht 64.57 in | Wt 123.7 lb

## 2022-08-15 DIAGNOSIS — J4540 Moderate persistent asthma, uncomplicated: Secondary | ICD-10-CM

## 2022-08-15 DIAGNOSIS — L501 Idiopathic urticaria: Secondary | ICD-10-CM

## 2022-08-15 DIAGNOSIS — J3089 Other allergic rhinitis: Secondary | ICD-10-CM | POA: Diagnosis not present

## 2022-08-15 DIAGNOSIS — L253 Unspecified contact dermatitis due to other chemical products: Secondary | ICD-10-CM

## 2022-08-15 DIAGNOSIS — K9049 Malabsorption due to intolerance, not elsewhere classified: Secondary | ICD-10-CM

## 2022-08-15 DIAGNOSIS — T781XXD Other adverse food reactions, not elsewhere classified, subsequent encounter: Secondary | ICD-10-CM

## 2022-08-15 DIAGNOSIS — L2084 Intrinsic (allergic) eczema: Secondary | ICD-10-CM | POA: Diagnosis not present

## 2022-08-15 MED ORDER — CLOBETASOL PROPIONATE 0.05 % EX OINT: 1.0000 | TOPICAL_OINTMENT | Freq: Two times a day (BID) | CUTANEOUS | 0 refills | Status: AC

## 2022-08-15 NOTE — Progress Notes (Signed)
Follow Up Note  RE: Roberta Wood MRN: [id number] DOB: Dec 13, 1980 Date of Office Visit: 08/15/2022  Referring provider: Maudie Flakes, FNP Primary care provider: Stevphen Rochester, MD  Chief Complaint: Asthma, Idiopathic Urticaria, Follow-up, and Rash  History of Present Illness: I had the pleasure of seeing Roberta Wood for a follow up visit at the [facility] on 08/15/2022. She is a 42 y.o. female, who is being followed for urticaria asthma, dermatitis, . Her previous allergy office visit was on 1/12/24with Dr. Marlynn Perking. Today is a regular follow up visit.  History obtained from patient, chart review.  Today she reports   She is tolerating Xolair well.  She has noticed a dramatic decrease in urticaria hives which last minutes however she does have intermittent flares on arms, legs.  Taking [name] 1-2 times a day.  No adverse effects of Xolair.  Not interested in cyclosporine at this time.  Also had a recent flare of erythematous fixed patches on bilateral ankles.  This lasted 3 weeks and was very pruritic.  She did use some ankle weights with metal which may contain nickel prior to onset of rash.  Tried hydrocortisone without good control.  Rhinitis symptoms are well-controlled.  Denies any breakthrough runny stuffy nose sneezing, itchy watery eyes.  She continues to avoid raw apples.  Asthma is well-controlled.  Using Dulera 1-2 times a week.  Not needing her albuterol at all.  No oral steroids or antibiotics since last visit.  Feels like since starting Xolair her asthma is dramatically improved.  She continues to have anxiety previously on hydroxyzine for anxiety and uses it sometimes for urticaria flares.  She may be starting Klonopin or Xanax in the future for panic attacks and is concerned about possible flares of urticaria.  She is also interested in Botox and concerned about that affecting her urticaria.  Denies any eczema flares.  Pertinent  History/Diagnostics:  - Asthma: moderate persistent   - normal spirometry (08/23/21): ratio 89, 3.03L, 106%  - Allergic Rhinitis:   - SPT environmental panel (08/23/21): molds - Food Allergy (apples)  - Hx of reaction: oral allergy  - SPT select foods ( 08/23/21): negative to apple  - Urticaria   - xolair 300mg  every 4 weeks started 12/2021  - urticaria labs:  (10/2021) CU index, tryptase, alpha -gal, CBC were normal/negative -Dermatitis   -Patch testing: positive to nickel     Assessment and Plan: Roberta Wood is a 42 y.o. female with: Moderate persistent asthma without complication - Plan: Spirometry with Graph  Idiopathic urticaria  Other allergic rhinitis  Intrinsic atopic dermatitis  Contact dermatitis due to chemicals  Pollen-food allergy, subsequent encounter Plan: Patient Instructions  Asthma Breathing test today showed: Looked great Continue Dulera 200-2 puffs twice a day as needed with a spacer to prevent cough or wheeze.   Continue albuterol 2 puffs once every 4 hours as needed for cough or wheeze You may use albuterol 2 puffs 5 to 15 minutes before activity to decrease cough or wheeze  Allergic rhinitis Continue allergen avoidance measures directed toward mold as listed below Continue [name] 5 mg once a day as needed for runny nose or itch.   Continue azelastine 2 sprays in each nostril up to twice a day as needed for runny nose Consider saline nasal rinses as needed for nasal symptoms. Use this before any medicated nasal sprays for best result  Hives (urticaria) Increase zyxal to 5mg  4 times a day as needed  to control your hives  Continue xolair 300mg  every 4 weeks Antianxiety meds and Botox should not cause a flare of urticaria  Atopic dermatitis Continue a twice a day moisturizing routine Continue  desonide 0.05% ointment to red and itchy areas up to twice a day as needed.  Do not use this medication for longer than 2 weeks in a row  Contact dermatitis Continue  to avoid nickel and products containing nickel  For flares start clobetasol twice a day until skin texture returns to normal   -Do not use on face, armpit or groin  Oral allergy syndrome Continue to avoid raw apples and apple peelings  Call the clinic if this treatment plan is not working well for you  Follow up: 6 months   Thank you so much for letting me partake in your care today.  Don't hesitate to reach out if you have any additional concerns!  Ferol Luz, MD  [facility] [place] [place]    No follow-ups on file.  Meds ordered this encounter  Medications   clobetasol ointment (TEMOVATE) 0.05 %    Sig: Apply 1 Application topically 2 (two) times daily.    Dispense:  30 g    Refill:  0    Lab Orders  No laboratory test(s) ordered today   Diagnostics: Spirometry:  Tracings reviewed. Her effort: Good reproducible efforts. FVC: 3.76L FEV1: 3.12L, 104% predicted FEV1/FVC ratio: 83% Interpretation: Spirometry consistent with normal pattern.  Please see scanned spirometry results for details.    Medication List:  Current Outpatient Medications  Medication Sig Dispense Refill   albuterol (VENTOLIN HFA) 108 (90 Base) MCG/ACT inhaler Inhale 2 puffs into the lungs every 4 (four) hours as needed. 8 g 1   azelastine (ASTELIN) 0.1 % nasal spray Place 2 sprays into both nostrils 2 (two) times daily. Use in each nostril as directed 30 mL 12   clobetasol ointment (TEMOVATE) 0.05 % Apply 1 Application topically 2 (two) times daily. 30 g 0   desonide (DESOWEN) 0.05 % ointment Apply to red and itchy areas up to twice a day as needed.  Do not use this medication for longer than 2 weeks in a row 15 g 5   EPINEPHrine 0.3 mg/0.3 mL IJ SOAJ injection Inject 0.3 mg into the muscle as needed for anaphylaxis. 1 each 1   ferrous sulfate 324 MG TBEC Take 324 mg by mouth 2 (two) times a week.     fluticasone (FLONASE) 50 MCG/ACT nasal spray Place 2 sprays into both  nostrils daily. 16 g 2   hydrOXYzine (ATARAX) 25 MG tablet Take by mouth.     levocetirizine ([name]) 5 MG tablet Take 1 tablet (5 mg total) by mouth every evening. 90 tablet 1   Magnesium 400 MG CAPS Take 400 mg by mouth 2 (two) times a week.     omalizumab Geoffry Paradise) 150 MG/ML prefilled syringe Inject 300 mg into the skin every 28 (twenty-eight) days. 2 mL 11   ondansetron (ZOFRAN) 4 MG tablet Take by mouth.     SUMAtriptan (IMITREX) 50 MG tablet Take by mouth.     UNABLE TO FIND Apply topically. Tretinion cream 3x a week     valACYclovir (VALTREX) 500 MG tablet valacyclovir 500 mg tablet  TAKE TWO TABLETS (1,000 MG DOSE) BY MOUTH DAILY.     mometasone-formoterol (DULERA) 200-5 MCG/ACT AERO Inhale 2 puffs into the lungs 2 (two) times daily. (Patient not taking: Reported on 08/15/2022) 13 g 5  NIKKI 3-0.02 MG tablet Take 1 tablet by mouth daily. (Patient not taking: Reported on 08/15/2022)     OMEPRAZOLE PO Take by mouth daily. (Patient not taking: Reported on 08/15/2022)     propranolol (INDERAL) 10 MG tablet Take by mouth.     traZODone (DESYREL) 50 MG tablet  (Patient not taking: Reported on 08/15/2022)     Current Facility-Administered Medications  Medication Dose Route Frequency Provider Last Rate Last Admin   omalizumab Geoffry Paradise) prefilled syringe 300 mg  300 mg Subcutaneous Q28 days Ferol Luz, MD   300 mg at 07/30/22 1428   Allergies: Allergies  Allergen Reactions   Nickel Rash    Dermatologist tested   Penicillins Hives    Has patient had a PCN reaction causing immediate rash, facial/tongue/throat swelling, SOB or lightheadedness with hypotension: No Has patient had a PCN reaction causing severe rash involving mucus membranes or skin necrosis: Yes Has patient had a PCN reaction that required hospitalization No Has patient had a PCN reaction occurring within the last 10 years: No If all of the above answers are "NO", then may proceed with Cephalosporin use.     I reviewed  her past medical history, social history, family history, and environmental history and no significant changes have been reported from her previous visit.  ROS: All others negative except as noted per HPI.   Objective: BP 98/74   Pulse 90   Temp 98.9 F (37.2 C) (Temporal)   Ht 5' 4.57" (1.64 m)   Wt 123 lb 11.2 oz (56.1 kg)   SpO2 99%   BMI 20.86 kg/m  Body mass index is 20.86 kg/m. General Appearance:  Alert, cooperative, no distress, appears stated age  Head:  Normocephalic, without obvious abnormality, atraumatic  Eyes:  Conjunctiva clear, EOM's intact  Nose: Nares normal,   Throat: Lips, tongue normal; teeth and gums normal,   Neck: Supple, symmetrical  Lungs:   clear to auscultation bilaterally, Respirations unlabored, no coughing  Heart:  regular rate and rhythm and no murmur, Appears well perfused  Extremities: No edema  Skin: Skin color, texture, turgor normal, no rashes or lesions on visualized portions of skin   Neurologic: No gross deficits   Previous notes and tests were reviewed. The plan was reviewed with the patient/family, and all questions/concerned were addressed.  It was my pleasure to see Roberta Wood today and participate in her care. Please feel free to contact me with any questions or concerns.  Sincerely,  Ferol Luz, MD  Allergy & Immunology  [facility] Office: [phone]

## 2022-08-15 NOTE — Patient Instructions (Addendum)
Asthma Breathing test today showed: Looked great Continue Dulera 200-2 puffs twice a day as needed with a spacer to prevent cough or wheeze.   Continue albuterol 2 puffs once every 4 hours as needed for cough or wheeze You may use albuterol 2 puffs 5 to 15 minutes before activity to decrease cough or wheeze  Allergic rhinitis Continue allergen avoidance measures directed toward mold as listed below Continue [name] 5 mg once a day as needed for runny nose or itch.   Continue azelastine 2 sprays in each nostril up to twice a day as needed for runny nose Consider saline nasal rinses as needed for nasal symptoms. Use this before any medicated nasal sprays for best result  Hives (urticaria) Increase zyxal to 5mg  4 times a day as needed to control your hives  Continue xolair 300mg  every 4 weeks Antianxiety meds and Botox should not cause a flare of urticaria  Atopic dermatitis Continue a twice a day moisturizing routine Continue  desonide 0.05% ointment to red and itchy areas up to twice a day as needed.  Do not use this medication for longer than 2 weeks in a row  Contact dermatitis Continue to avoid nickel and products containing nickel  For flares start clobetasol twice a day until skin texture returns to normal   -Do not use on face, armpit or groin  Oral allergy syndrome Continue to avoid raw apples and apple peelings  Call the clinic if this treatment plan is not working well for you  Follow up: 6 months   Thank you so much for letting me partake in your care today.  Don't hesitate to reach out if you have any additional concerns!  Ferol Luz, MD  [facility] [place] [place]

## 2022-08-21 ENCOUNTER — Ambulatory Visit
Admission: RE | Admit: 2022-08-21 | Discharge: 2022-08-21 | Disposition: A | Discharge status: Home / Self Care | Payer: No Typology Code available for payment source | Source: Ambulatory Visit | Attending: Obstetrics & Gynecology | Admitting: Obstetrics & Gynecology

## 2022-08-21 DIAGNOSIS — Z1231 Encounter for screening mammogram for malignant neoplasm of breast: Secondary | ICD-10-CM

## 2022-09-01 ENCOUNTER — Ambulatory Visit: Payer: No Typology Code available for payment source | Admitting: *Deleted

## 2022-09-01 DIAGNOSIS — L501 Idiopathic urticaria: Secondary | ICD-10-CM

## 2022-09-29 ENCOUNTER — Ambulatory Visit: Payer: No Typology Code available for payment source | Admitting: *Deleted

## 2022-09-29 DIAGNOSIS — L501 Idiopathic urticaria: Secondary | ICD-10-CM

## 2022-10-27 ENCOUNTER — Ambulatory Visit: Payer: No Typology Code available for payment source

## 2022-10-27 DIAGNOSIS — L501 Idiopathic urticaria: Secondary | ICD-10-CM

## 2022-11-24 ENCOUNTER — Ambulatory Visit: Payer: No Typology Code available for payment source

## 2022-11-24 DIAGNOSIS — L501 Idiopathic urticaria: Secondary | ICD-10-CM

## 2022-12-22 ENCOUNTER — Ambulatory Visit: Payer: No Typology Code available for payment source

## 2022-12-22 DIAGNOSIS — L501 Idiopathic urticaria: Secondary | ICD-10-CM | POA: Diagnosis not present

## 2023-01-19 ENCOUNTER — Ambulatory Visit: Payer: No Typology Code available for payment source | Admitting: *Deleted

## 2023-01-19 DIAGNOSIS — L501 Idiopathic urticaria: Secondary | ICD-10-CM | POA: Diagnosis not present

## 2023-02-20 ENCOUNTER — Ambulatory Visit: Payer: No Typology Code available for payment source | Admitting: Internal Medicine

## 2023-02-20 ENCOUNTER — Ambulatory Visit: Payer: No Typology Code available for payment source | Admitting: *Deleted

## 2023-02-20 DIAGNOSIS — L501 Idiopathic urticaria: Secondary | ICD-10-CM

## 2023-03-06 ENCOUNTER — Other Ambulatory Visit: Payer: Self-pay

## 2023-03-06 ENCOUNTER — Ambulatory Visit (INDEPENDENT_AMBULATORY_CARE_PROVIDER_SITE_OTHER): Payer: No Typology Code available for payment source | Admitting: Internal Medicine

## 2023-03-06 ENCOUNTER — Encounter: Payer: Self-pay | Admitting: Internal Medicine

## 2023-03-06 VITALS — BP 124/80 | HR 89 | Temp 99.1°F | Resp 17

## 2023-03-06 DIAGNOSIS — T781XXD Other adverse food reactions, not elsewhere classified, subsequent encounter: Secondary | ICD-10-CM

## 2023-03-06 DIAGNOSIS — L2084 Intrinsic (allergic) eczema: Secondary | ICD-10-CM

## 2023-03-06 DIAGNOSIS — J3089 Other allergic rhinitis: Secondary | ICD-10-CM | POA: Diagnosis not present

## 2023-03-06 DIAGNOSIS — L501 Idiopathic urticaria: Secondary | ICD-10-CM

## 2023-03-06 DIAGNOSIS — J4540 Moderate persistent asthma, uncomplicated: Secondary | ICD-10-CM

## 2023-03-06 DIAGNOSIS — L230 Allergic contact dermatitis due to metals: Secondary | ICD-10-CM

## 2023-03-06 NOTE — Progress Notes (Unsigned)
Follow Up Note  RE: Roberta Wood MRN: [id number] DOB: 1980/10/09 Date of Office Visit: 03/06/2023  Referring provider: Stevphen Rochester, MD Primary care provider: Stevphen Rochester, MD  Chief Complaint: Asthma and Urticaria  History of Present Illness: I had the pleasure of seeing Roberta Wood for a follow up visit at the [facility] on 03/08/2023. She is a 42 y.o. female, who is being followed for urticaria asthma, dermatitis, . Her previous allergy office visit was on 01/10/22 with Dr. Marlynn Perking. Today is a regular follow up visit.  History obtained from patient, chart review. The patient, with a history of asthma and angular pleuritis, presents with a new onset of dermatitis-like symptoms. She reports an intermittent, light itchiness around the mouth, occasionally accompanied by a twinge of pain. The patient denies any significant changes in her oral hygiene products or lipstick use. She has noticed occasional slight swelling and redness in the area.  The patient also reports a recent episode of an unusual tongue sensation, which she could not attribute to any specific cause. She has a known nickel allergy and uses metal water bottles and straws, which she suspects could be contributing to her symptoms.  The patient's asthma is reportedly well-controlled, and she has noticed further improvement since her last consultation. She is currently on Xolair for asthma management. Using dulera 2-3 times per week. No OCS/ABX since last visit.  Urticaria is controlled.   The patient also mentions potential exposure to smoke from a wood-burning stove at home, which she is concerned could affect her asthma control.  On xyzal and flonase as needed (2-3 times per week in spring).    She continues to avoid raw apples.  Pertinent History/Diagnostics:  - Asthma: moderate persistent   - normal spirometry (08/23/21): ratio 89, 3.03L, 106%  - Allergic Rhinitis:   - SPT  environmental panel (08/23/21): molds - Food Allergy (apples)  - Hx of reaction: oral allergy  - SPT select foods ( 08/23/21): negative to apple  - Urticaria   - xolair 300mg  every 4 weeks started 12/2021  - urticaria labs:  (10/2021) CU index, tryptase, alpha -gal, CBC were normal/negative -Dermatitis   -Patch testing: positive to nickel     Assessment and Plan: Roberta Wood is a 42 y.o. female with: Idiopathic urticaria  Moderate persistent asthma without complication - Plan: Spirometry with Graph  Allergic contact dermatitis due to metals  Pollen-food allergy, subsequent encounter  Other allergic rhinitis  Intrinsic atopic dermatitis Plan: Patient Instructions  Asthma Breathing test today showed: Looked great Continue Dulera 200-2 puffs twice a day as needed with a spacer to prevent cough or wheeze.   Continue albuterol 2 puffs once every 4 hours as needed for cough or wheeze You may use albuterol 2 puffs 5 to 15 minutes before activity to decrease cough or wheeze  Allergic rhinitis Continue allergen avoidance measures directed toward mold as listed below Continue [name] 5 mg once a day as needed for runny nose or itch.   Continue azelastine 2 sprays in each nostril up to twice a day as needed for runny nose Consider saline nasal rinses as needed for nasal symptoms. Use this before any medicated nasal sprays for best result  Hives (urticaria) Continue zyxal to 5mg  1-4 times a day as needed to control hives   Continue xolair 300mg  every 4 weeks  Atopic dermatitis Continue a twice a day moisturizing routine Continue  desonide 0.05% ointment to red and itchy areas  up to twice a day as needed.  Do not use this medication for longer than 2 weeks in a row  Contact dermatitis  Hydrocortisone 2.5% cream twice a day as needed on lips   -avoid metal straws  Continue to avoid nickel and products containing nickel  For flares start clobetasol twice a day until skin texture returns to  normal   Oral allergy syndrome Continue to avoid raw apples and apple peelings  Call the clinic if this treatment plan is not working well for you  Follow up: 6 months   Thank you so much for letting me partake in your care today.  Don't hesitate to reach out if you have any additional concerns!  Ferol Luz, MD  [facility] [place] [place]   No follow-ups on file.  No orders of the defined types were placed in this encounter.   Lab Orders  No laboratory test(s) ordered today   Diagnostics: Spirometry:  Tracings reviewed. Her effort: Good reproducible efforts. FVC: 3.47L FEV1: 2.95L, 98% predicted FEV1/FVC ratio: 85% Interpretation: Spirometry consistent with normal pattern.  Please see scanned spirometry results for details.    Medication List:  Current Outpatient Medications  Medication Sig Dispense Refill   albuterol (VENTOLIN HFA) 108 (90 Base) MCG/ACT inhaler Inhale 2 puffs into the lungs every 4 (four) hours as needed. 8 g 1   azelastine (ASTELIN) 0.1 % nasal spray Place 2 sprays into both nostrils 2 (two) times daily. Use in each nostril as directed 30 mL 12   busPIRone (BUSPAR) 10 MG tablet Take 10 mg by mouth 3 (three) times daily as needed.     clobetasol ointment (TEMOVATE) 0.05 % Apply 1 Application topically 2 (two) times daily. 30 g 0   desonide (DESOWEN) 0.05 % ointment Apply to red and itchy areas up to twice a day as needed.  Do not use this medication for longer than 2 weeks in a row 15 g 5   EPINEPHrine 0.3 mg/0.3 mL IJ SOAJ injection Inject 0.3 mg into the muscle as needed for anaphylaxis. 1 each 1   ferrous sulfate 324 MG TBEC Take 324 mg by mouth 2 (two) times a week.     fluticasone (FLONASE) 50 MCG/ACT nasal spray Place 2 sprays into both nostrils daily. 16 g 2   hydrOXYzine (ATARAX) 25 MG tablet Take by mouth.     levocetirizine ([name]) 5 MG tablet Take 1 tablet (5 mg total) by mouth every evening. 90 tablet 1   Magnesium 400  MG CAPS Take 400 mg by mouth 2 (two) times a week.     mometasone-formoterol (DULERA) 200-5 MCG/ACT AERO Inhale 2 puffs into the lungs 2 (two) times daily. 13 g 5   NURTEC 75 MG TBDP Take 1 tablet by mouth daily as needed.     omalizumab Geoffry Paradise) 150 MG/ML prefilled syringe Inject 300 mg into the skin every 28 (twenty-eight) days. 2 mL 11   ondansetron (ZOFRAN) 4 MG tablet Take by mouth.     SUMAtriptan (IMITREX) 50 MG tablet Take by mouth.     UNABLE TO FIND Apply topically. Tretinion cream 3x a week     valACYclovir (VALTREX) 500 MG tablet valacyclovir 500 mg tablet  TAKE TWO TABLETS (1,000 MG DOSE) BY MOUTH DAILY.     NIKKI 3-0.02 MG tablet Take 1 tablet by mouth daily. (Patient not taking: Reported on 03/06/2023)     OMEPRAZOLE PO Take by mouth daily. (Patient not taking: Reported on 03/06/2023)  propranolol (INDERAL) 10 MG tablet Take by mouth.     traZODone (DESYREL) 50 MG tablet  (Patient not taking: Reported on 03/06/2023)     Current Facility-Administered Medications  Medication Dose Route Frequency Provider Last Rate Last Admin   omalizumab Geoffry Paradise) prefilled syringe 300 mg  300 mg Subcutaneous Q28 days Ferol Luz, MD   300 mg at 02/20/23 1331   Allergies: Allergies  Allergen Reactions   Nsaids    Nickel Rash    Dermatologist tested   Penicillins Hives    Has patient had a PCN reaction causing immediate rash, facial/tongue/throat swelling, SOB or lightheadedness with hypotension: No Has patient had a PCN reaction causing severe rash involving mucus membranes or skin necrosis: Yes Has patient had a PCN reaction that required hospitalization No Has patient had a PCN reaction occurring within the last 10 years: No If all of the above answers are "NO", then may proceed with Cephalosporin use.     I reviewed her past medical history, social history, family history, and environmental history and no significant changes have been reported from her previous visit.  ROS:  All others negative except as noted per HPI.   Objective: BP 124/80 (BP Location: Left Arm, Patient Position: Sitting, Cuff Size: Normal)   Pulse 89   Temp 99.1 F (37.3 C) (Temporal)   Resp 17   SpO2 100%  There is no height or weight on file to calculate BMI. General Appearance:  Alert, cooperative, no distress, appears stated age  Head:  Normocephalic, without obvious abnormality, atraumatic  Eyes:  Conjunctiva clear, EOM's intact  Nose: Nares normal,   Throat: Lips, tongue normal; teeth and gums normal,   Neck: Supple, symmetrical  Lungs:   clear to auscultation bilaterally, Respirations unlabored, no coughing  Heart:  regular rate and rhythm and no murmur, Appears well perfused  Extremities: No edema  Skin: Skin color, texture, turgor normal, no rashes or lesions on visualized portions of skin   Neurologic: No gross deficits   Previous notes and tests were reviewed. The plan was reviewed with the patient/family, and all questions/concerned were addressed.  It was my pleasure to see Roberta Wood today and participate in her care. Please feel free to contact me with any questions or concerns.  Sincerely,  Ferol Luz, MD  Allergy & Immunology  [facility] Office: [phone]

## 2023-03-06 NOTE — Patient Instructions (Addendum)
Asthma Breathing test today showed: Looked great Continue Dulera 200-2 puffs twice a day as needed with a spacer to prevent cough or wheeze.   Continue albuterol 2 puffs once every 4 hours as needed for cough or wheeze You may use albuterol 2 puffs 5 to 15 minutes before activity to decrease cough or wheeze  Allergic rhinitis Continue allergen avoidance measures directed toward mold as listed below Continue [name] 5 mg once a day as needed for runny nose or itch.   Continue azelastine 2 sprays in each nostril up to twice a day as needed for runny nose Consider saline nasal rinses as needed for nasal symptoms. Use this before any medicated nasal sprays for best result  Hives (urticaria) Continue zyxal to 5mg  1-4 times a day as needed to control hives   Continue xolair 300mg  every 4 weeks  Atopic dermatitis Continue a twice a day moisturizing routine Continue  desonide 0.05% ointment to red and itchy areas up to twice a day as needed.  Do not use this medication for longer than 2 weeks in a row  Contact dermatitis  Hydrocortisone 2.5% cream twice a day as needed on lips   -avoid metal straws  Continue to avoid nickel and products containing nickel  For flares start clobetasol twice a day until skin texture returns to normal   Oral allergy syndrome Continue to avoid raw apples and apple peelings  Call the clinic if this treatment plan is not working well for you  Follow up: 6 months   Thank you so much for letting me partake in your care today.  Don't hesitate to reach out if you have any additional concerns!  Ferol Luz, MD  [facility] [place], [place]

## 2023-03-17 ENCOUNTER — Telehealth: Payer: Self-pay

## 2023-03-17 NOTE — Telephone Encounter (Signed)
 Patient called stating she has a rash around her mouth. She states her note she received from our office said to use clobetasol . She is questioning this as she was told not to use it around her mouth. I am not seeing where the patient is supposed to be on Clobetasol . I am seeing Desonide . Please call patient to clarify.  Thanks

## 2023-03-17 NOTE — Telephone Encounter (Signed)
 Per Provider 03/06/23 office note:  Atopic dermatitis Continue a twice a day moisturizing routine Continue  desonide  0.05% ointment to red and itchy areas up to twice a day as needed.  Do not use this medication for longer than 2 weeks in a row   Contact dermatitis  Hydrocortisone  2.5% cream twice a day as needed on lips              -avoid metal straws  Continue to avoid nickel and products containing nickel   For flares start clobetasol  twice a day until skin texture returns to normal    Called patient - DOB verified - stated she wanted to make sure she can use the Clobetasol  ointment on/around her lips.  Patient was given office email to send pictures of her mouth - advised message would be forwarded to provider for clarity - office will be closed tomorrow, Wednesday for New Year's Day - response may be on Thursday, 03/18/22.  Patient verbalized understanding to all, no further questions.

## 2023-03-20 ENCOUNTER — Ambulatory Visit (INDEPENDENT_AMBULATORY_CARE_PROVIDER_SITE_OTHER): Payer: No Typology Code available for payment source

## 2023-03-20 ENCOUNTER — Encounter: Payer: Self-pay | Admitting: Internal Medicine

## 2023-03-20 DIAGNOSIS — L501 Idiopathic urticaria: Secondary | ICD-10-CM

## 2023-03-20 NOTE — Telephone Encounter (Signed)
 Forwarding message to provider for next step.    Pictures have been forwarded to provider's email as well via our office email.

## 2023-03-23 ENCOUNTER — Other Ambulatory Visit: Payer: Self-pay | Admitting: Internal Medicine

## 2023-03-24 NOTE — Telephone Encounter (Signed)
 I would not sue clobetasol, that is for areas of the body.  The desonide is for face, armpit and groin.  Thanks!

## 2023-03-24 NOTE — Telephone Encounter (Signed)
Called patient - DOB verified - advised of provider notation below.  Patient verbalized understanding, no questions.

## 2023-04-17 ENCOUNTER — Ambulatory Visit (INDEPENDENT_AMBULATORY_CARE_PROVIDER_SITE_OTHER): Payer: No Typology Code available for payment source | Admitting: *Deleted

## 2023-04-17 DIAGNOSIS — L501 Idiopathic urticaria: Secondary | ICD-10-CM | POA: Diagnosis not present

## 2023-05-13 ENCOUNTER — Ambulatory Visit (INDEPENDENT_AMBULATORY_CARE_PROVIDER_SITE_OTHER): Payer: No Typology Code available for payment source | Admitting: *Deleted

## 2023-05-13 DIAGNOSIS — L501 Idiopathic urticaria: Secondary | ICD-10-CM | POA: Diagnosis not present

## 2023-06-01 ENCOUNTER — Encounter: Payer: Self-pay | Admitting: Pulmonary Disease

## 2023-06-10 ENCOUNTER — Ambulatory Visit: Payer: No Typology Code available for payment source | Admitting: *Deleted

## 2023-06-10 DIAGNOSIS — L501 Idiopathic urticaria: Secondary | ICD-10-CM | POA: Diagnosis not present

## 2023-06-11 ENCOUNTER — Encounter: Payer: Self-pay | Admitting: Internal Medicine

## 2023-07-08 ENCOUNTER — Ambulatory Visit

## 2023-07-08 DIAGNOSIS — L501 Idiopathic urticaria: Secondary | ICD-10-CM | POA: Diagnosis not present

## 2023-08-05 ENCOUNTER — Ambulatory Visit (INDEPENDENT_AMBULATORY_CARE_PROVIDER_SITE_OTHER)

## 2023-08-05 DIAGNOSIS — L501 Idiopathic urticaria: Secondary | ICD-10-CM

## 2023-09-02 ENCOUNTER — Ambulatory Visit (INDEPENDENT_AMBULATORY_CARE_PROVIDER_SITE_OTHER)

## 2023-09-02 DIAGNOSIS — L501 Idiopathic urticaria: Secondary | ICD-10-CM | POA: Diagnosis not present

## 2023-09-04 ENCOUNTER — Ambulatory Visit: Payer: No Typology Code available for payment source | Admitting: Internal Medicine

## 2023-09-04 ENCOUNTER — Encounter: Payer: Self-pay | Admitting: Internal Medicine

## 2023-09-04 VITALS — BP 102/60 | HR 96 | Resp 16

## 2023-09-04 DIAGNOSIS — J3089 Other allergic rhinitis: Secondary | ICD-10-CM

## 2023-09-04 DIAGNOSIS — L230 Allergic contact dermatitis due to metals: Secondary | ICD-10-CM

## 2023-09-04 DIAGNOSIS — R197 Diarrhea, unspecified: Secondary | ICD-10-CM | POA: Diagnosis not present

## 2023-09-04 DIAGNOSIS — J4540 Moderate persistent asthma, uncomplicated: Secondary | ICD-10-CM | POA: Diagnosis not present

## 2023-09-04 DIAGNOSIS — T781XXD Other adverse food reactions, not elsewhere classified, subsequent encounter: Secondary | ICD-10-CM | POA: Diagnosis not present

## 2023-09-04 DIAGNOSIS — L2084 Intrinsic (allergic) eczema: Secondary | ICD-10-CM

## 2023-09-04 DIAGNOSIS — L501 Idiopathic urticaria: Secondary | ICD-10-CM

## 2023-09-04 DIAGNOSIS — L2481 Irritant contact dermatitis due to metals: Secondary | ICD-10-CM

## 2023-09-04 NOTE — Patient Instructions (Addendum)
 Asthma, controlled  Continue Dulera  200-2 puffs twice a day as needed with a spacer to prevent cough or wheeze.   Continue albuterol  2 puffs once every 4 hours as needed for cough or wheeze You may use albuterol  2 puffs 5 to 15 minutes before activity to decrease cough or wheeze  Allergic rhinitis Continue allergen avoidance measures directed toward mold as listed below Continue [name]  5 mg once a day as needed for runny nose or itch.   Continue azelastine  2 sprays in each nostril up to twice a day as needed for runny nose Consider saline nasal rinses as needed for nasal symptoms. Use this before any medicated nasal sprays for best result  Hives (urticaria), controlled  Continue zyxal to 5mg  1-4 times a day as needed to control hives   Continue xolair  300mg  every 4 weeks  Atopic dermatitis Continue a twice a day moisturizing routine Continue  desonide  0.05% ointment to red and itchy areas up to twice a day as needed.  Do not use this medication for longer than 2 weeks in a row  Contact dermatitis  Hydrocortisone  2.5% cream twice a day as needed for flares on face, armpit, groin  Clobetasol  twice a day as needed for flares for body  Continue to avoid nickel and products containing nickel  Oral allergy  syndrome Continue to avoid raw apples and apple peelings   Diarrhea  - Celiac panel ordered, you can walk in to get this drawn   Call the clinic if this treatment plan is not working well for you  Follow up: 6 months   Thank you so much for letting me partake in your care today.  Don't hesitate to reach out if you have any additional concerns!  Orelia Binet, MD  [facility] [place] [place]

## 2023-09-04 NOTE — Progress Notes (Signed)
 Follow Up Note  RE: Roberta Wood MRN: [id number] DOB: 1980-08-18 Date of Office Visit: 09/04/2023  Referring provider: Authur Leghorn, MD Primary care provider: Authur Leghorn, MD  Chief Complaint: Urticaria  History of Present Illness: I had the pleasure of seeing Roberta Wood for a follow up visit at the [facility] on 09/04/2023. She is a 43 y.o. female, who is being followed for urticaria asthma, dermatitis, . Her previous allergy  office visit was on 03/06/23 with Dr. Jolayne Natter. Today is a regular follow up visit.  History obtained from patient, chart review. Discussed the use of AI scribe software for clinical note transcription with the patient, who gave verbal consent to proceed.  History of Present Illness Roberta Wood is a 43 year old female with allergies and asthma who presents for follow-up on her allergy  management with Xolair .  While on Xolair , she experiences occasional small hives around her mouth, resolving within a few hours to a day. These episodes are less frequent and often occur after exposure to allergens, such as proximity to someone eating an apple.  Otherwise she is happy with current level of control.  She has not had any recent rashes but recalls developing a rash during a previous trip to [place] , which she attributes to the humid climate. She is planning a trip to [place]  and intends to bring her steroid creams as a precaution.  During the spring, she experienced seasonal allergy  symptoms, including a runny and stuffy nose, describing it as a particularly bad season for allergies. These symptoms were manageable and not severe.  Her asthma remains stable, and she uses Dulera  only when her allergies are exacerbated or during a cold.  She recently visited a gastroenterologist who suggested testing for celiac disease due to her symptoms, although the order was not placed. Hemorrhoids were discovered during the  visit, and she is considering options for removal.  She was sick with a stomach bug last week, resulting in dehydration, and prefers to return for blood work at a later time when she is better hydrated.   She continues to avoid raw apples.  Pertinent History/Diagnostics:  - Asthma: moderate persistent   - normal spirometry (08/23/21): ratio 89, 3.03L, 106%  - Allergic Rhinitis:   - SPT environmental panel (08/23/21): molds - Food Allergy  (apples)  - Hx of reaction: oral allergy   - SPT select foods ( 08/23/21): negative to apple  - Urticaria   - xolair  300mg  every 4 weeks started 12/2021  - urticaria labs:  (10/2021) CU index, tryptase, alpha -gal, CBC were normal/negative -Dermatitis   -Patch testing: positive to nickel     Assessment and Plan: Roberta Wood is a 43 y.o. female with: Idiopathic urticaria  Diarrhea, unspecified type - Plan: Celiac Disease Panel  Moderate persistent asthma without complication  Pollen-food allergy , subsequent encounter  Allergic contact dermatitis due to metals  Other allergic rhinitis  Intrinsic atopic dermatitis Plan: Patient Instructions  Asthma, controlled  Continue Dulera  200-2 puffs twice a day as needed with a spacer to prevent cough or wheeze.   Continue albuterol  2 puffs once every 4 hours as needed for cough or wheeze You may use albuterol  2 puffs 5 to 15 minutes before activity to decrease cough or wheeze  Allergic rhinitis Continue allergen avoidance measures directed toward mold as listed below Continue [name]  5 mg once a day as needed for runny nose or itch.   Continue azelastine  2 sprays in each nostril up  to twice a day as needed for runny nose Consider saline nasal rinses as needed for nasal symptoms. Use this before any medicated nasal sprays for best result  Hives (urticaria), controlled  Continue zyxal to 5mg  1-4 times a day as needed to control hives   Continue xolair  300mg  every 4 weeks  Atopic dermatitis Continue a twice  a day moisturizing routine Continue  desonide  0.05% ointment to red and itchy areas up to twice a day as needed.  Do not use this medication for longer than 2 weeks in a row  Contact dermatitis  Hydrocortisone  2.5% cream twice a day as needed for flares on face, armpit, groin  Clobetasol  twice a day as needed for flares for body  Continue to avoid nickel and products containing nickel  Oral allergy  syndrome Continue to avoid raw apples and apple peelings   Diarrhea  - Celiac panel ordered, you can walk in to get this drawn   Call the clinic if this treatment plan is not working well for you  Follow up: 6 months   Thank you so much for letting me partake in your care today.  Don't hesitate to reach out if you have any additional concerns!  Roberta Binet, MD  [facility] [place], [place]  No follow-ups on file.  No orders of the defined types were placed in this encounter.   Lab Orders         Celiac Disease Panel     Diagnostics: Spirometry:  Tracings reviewed. Her effort: Good reproducible efforts. FVC: 3.47L FEV1: 2.95L, 98% predicted FEV1/FVC ratio: 85% Interpretation: Spirometry consistent with normal pattern.  Please see scanned spirometry results for details.    Medication List:  Current Outpatient Medications  Medication Sig Dispense Refill   albuterol  (VENTOLIN  HFA) 108 (90 Base) MCG/ACT inhaler Inhale 2 puffs into the lungs every 4 (four) hours as needed. 8 g 1   azelastine  (ASTELIN ) 0.1 % nasal spray Place 2 sprays into both nostrils 2 (two) times daily. Use in each nostril as directed 30 mL 12   busPIRone (BUSPAR) 10 MG tablet Take 10 mg by mouth 3 (three) times daily as needed.     clobetasol  ointment (TEMOVATE ) 0.05 % Apply 1 Application topically 2 (two) times daily. 30 g 0   desonide  (DESOWEN ) 0.05 % ointment Apply to red and itchy areas up to twice a day as needed.  Do not use this medication for longer than 2 weeks in a row 15 g 5    EPINEPHrine  0.3 mg/0.3 mL IJ SOAJ injection Inject 0.3 mg into the muscle as needed for anaphylaxis. 1 each 1   ferrous sulfate 324 MG TBEC Take 324 mg by mouth 2 (two) times a week.     fluticasone  (FLONASE ) 50 MCG/ACT nasal spray Place 2 sprays into both nostrils daily. 16 g 2   hydrOXYzine (ATARAX) 25 MG tablet Take by mouth.     levocetirizine ([name] ) 5 MG tablet Take 1 tablet (5 mg total) by mouth every evening. 90 tablet 1   Magnesium  400 MG CAPS Take 400 mg by mouth 2 (two) times a week.     mometasone-formoterol  (DULERA ) 200-5 MCG/ACT AERO Inhale 2 puffs into the lungs 2 (two) times daily. 13 g 5   NURTEC 75 MG TBDP Take 1 tablet by mouth daily as needed.     ondansetron  (ZOFRAN ) 4 MG tablet Take by mouth.     propranolol (INDERAL) 10 MG tablet Take by mouth.  SUMAtriptan (IMITREX) 50 MG tablet Take by mouth.     UNABLE TO FIND Apply topically. Tretinion cream 3x a week     valACYclovir (VALTREX) 500 MG tablet valacyclovir 500 mg tablet  TAKE TWO TABLETS (1,000 MG DOSE) BY MOUTH DAILY.     XOLAIR  150 MG/ML prefilled syringe INJECT 2 SYRINGES UNDER THE SKIN EVERY 4 WEEKS 2 mL 11   NIKKI 3-0.02 MG tablet Take 1 tablet by mouth daily. (Patient not taking: Reported on 03/06/2023)     OMEPRAZOLE PO Take by mouth daily. 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     I reviewed her past medical history, social history, family history, and environmental history and no significant changes have been reported from her previous visit.  ROS: All others negative except as noted per HPI.   Objective: BP 102/60   Pulse 96   Resp 16   SpO2 95%  There is no height or weight on file to calculate BMI. General Appearance:  Alert, cooperative, no distress, appears stated age  Head:  Normocephalic, without obvious abnormality, atraumatic  Eyes:  Conjunctiva clear, EOM's intact  Nose: Nares normal,   Throat: Lips, tongue normal; teeth and gums normal,   Neck: Supple, symmetrical  Lungs:   clear to auscultation bilaterally, Respirations unlabored, no coughing  Heart:  regular rate and rhythm and no murmur, Appears well perfused  Extremities: No edema  Skin: Skin color, texture, turgor normal, no rashes or lesions on visualized portions of skin   Neurologic: No gross deficits   Previous notes and tests were reviewed. The plan was reviewed with the patient/family, and all questions/concerned were addressed.  It was my pleasure to see Roberta Wood today and participate in her care. Please feel free to contact me with any questions or concerns.  Sincerely,  Roberta Binet, MD  Allergy  & Immunology  [facility] [place]  [place] Office: [phone]

## 2023-09-27 LAB — CELIAC DISEASE PANEL
Endomysial IgA: POSITIVE — AB
IgA/Immunoglobulin A, Serum: 238 mg/dL (ref 87–352)
Transglutaminase IgA: 18 U/mL — AB (ref 0–3)

## 2023-09-29 ENCOUNTER — Ambulatory Visit: Payer: Self-pay | Admitting: Internal Medicine

## 2023-09-29 NOTE — Progress Notes (Signed)
 Can someone let Roberta Wood know her celiac panel was positive and we are referring her to GI   Can we facilitate a referral to [facility] for [facility] for concern for celiacs?

## 2023-09-30 ENCOUNTER — Ambulatory Visit

## 2023-09-30 DIAGNOSIS — L501 Idiopathic urticaria: Secondary | ICD-10-CM | POA: Diagnosis not present

## 2023-10-02 ENCOUNTER — Ambulatory Visit (INDEPENDENT_AMBULATORY_CARE_PROVIDER_SITE_OTHER): Admitting: Internal Medicine

## 2023-10-02 ENCOUNTER — Other Ambulatory Visit: Payer: Self-pay

## 2023-10-02 VITALS — BP 110/64 | HR 89 | Temp 98.1°F | Resp 18 | Ht 64.0 in | Wt 126.0 lb

## 2023-10-02 DIAGNOSIS — R768 Other specified abnormal immunological findings in serum: Secondary | ICD-10-CM | POA: Diagnosis not present

## 2023-10-02 DIAGNOSIS — J4540 Moderate persistent asthma, uncomplicated: Secondary | ICD-10-CM | POA: Diagnosis not present

## 2023-10-02 DIAGNOSIS — J3089 Other allergic rhinitis: Secondary | ICD-10-CM

## 2023-10-02 DIAGNOSIS — T781XXD Other adverse food reactions, not elsewhere classified, subsequent encounter: Secondary | ICD-10-CM | POA: Diagnosis not present

## 2023-10-02 DIAGNOSIS — L501 Idiopathic urticaria: Secondary | ICD-10-CM | POA: Diagnosis not present

## 2023-10-02 DIAGNOSIS — L2389 Allergic contact dermatitis due to other agents: Secondary | ICD-10-CM

## 2023-10-02 NOTE — Progress Notes (Signed)
 Follow Up Note  RE: Roberta Wood MRN: [id number] DOB: 1980/11/02 Date of Office Visit: 10/02/2023  Referring provider: Gladystine Erminio CROME, MD Primary care provider: Gladystine Erminio CROME, MD  Chief Complaint: Follow-up (Patient was recently diagnosed with celiac disease and her priamry care sent her here for further information.)  History of Present Illness: I had the pleasure of seeing Roberta Wood for a follow up visit at the [facility] on 10/06/2023. She is a 43 y.o. female, who is being followed for urticaria asthma, dermatitis, . Her previous allergy  office visit was on 09/04/23 with Dr. Lorin. Today is a regular follow up visit.  History obtained from patient, chart review. Discussed the use of AI scribe software for clinical note transcription with the patient, who gave verbal consent to proceed.  History of Present Illness Roberta Wood is a 43 year old female who presents for further discussion and management of potential celiac disease.  Evaluation for celiac disease - Concern regarding positive celiac serology panel and need for endoscopic confirmation of diagnosis - Currently consuming gluten and has not initiated a gluten-free diet pending further diagnostic procedures - Aware of potential complications of celiac disease, including malabsorption, vitamin D deficiency, and osteoporosis - She is already established care with GI who had recommended celiac testing but not ordered lab.  We ordered at our last visit for convenience for patient.  She understands the need to report the results and follow-up with established GI.  Gastrointestinal and nutritional manifestations - History of low iron levels - Recent colonoscopy in April was unremarkable, with no abnormalities identified in the colon  Cutaneous manifestations - Worsening skin issues over the past few months, including acne and eczema - Concern regarding possible association  with celiac disease,  Allergic reactions and medication use - Currently receiving Xolair  for allergic reactions - Inquires whether a diagnosis of celiac disease would impact the need for Xolair   Psychological impact and eating disorder - Experiencing emotional distress related to the potential diagnosis of celiac disease - 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  Pertinent History/Diagnostics:  - Asthma: moderate persistent   - normal spirometry (08/23/21): ratio 89, 3.03L, 106%  - Allergic Rhinitis:   - SPT environmental panel (08/23/21): molds - Food Allergy  (apples)  - Hx of reaction: oral allergy   - SPT select foods ( 08/23/21): negative to apple  - Urticaria   - xolair  300mg  every 4 weeks started 12/2021  - urticaria labs:  (10/2021) CU index, tryptase, alpha -gal, CBC were normal/negative -Dermatitis   -Patch testing: positive to nickel     Assessment and Plan: Roberta Wood is a 43 y.o. 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  Idiopathic urticaria  Moderate persistent asthma without complication  Pollen-food allergy , subsequent encounter  Allergic contact dermatitis due to other agents  Chronic idiopathic urticaria  Other allergic rhinitis Plan: Patient Instructions  Positive celiac testing Urged patient to report results of GI Next step would likely be endoscopy and biopsies Instruct her not to avoid gluten at this time but she will need to be on gluten during her endoscopy Counseled patient to continue to engage with eating disorder therapy as she navigates this potentially new diagnosis  Asthma, controlled  Continue Dulera  200-2 puffs twice a day as needed with a spacer  to prevent cough or wheeze.   Continue albuterol  2 puffs once every 4 hours as needed for cough or wheeze You may use albuterol  2  puffs 5 to 15 minutes before activity to decrease cough or wheeze  Allergic rhinitis Continue allergen avoidance measures directed toward mold as listed below Continue [name]  5 mg once a day as needed for runny nose or itch.   Continue azelastine  2 sprays in each nostril up to twice a day as needed for runny nose Consider saline nasal rinses as needed for nasal symptoms. Use this before any medicated nasal sprays for best result  Hives (urticaria), controlled  Continue zyxal to 5mg  1-4 times a day as needed to control hives   Continue xolair  300mg  every 4 weeks  Atopic dermatitis Continue a twice a day moisturizing routine Continue  desonide  0.05% ointment to red and itchy areas up to twice a day as needed.  Do not use this medication for longer than 2 weeks in a row  Contact dermatitis  Hydrocortisone  2.5% cream twice a day as needed for flares on face, armpit, groin  Clobetasol  twice a day as needed for flares for body  Continue to avoid nickel and products containing nickel  Oral allergy  syndrome Continue to avoid raw apples and apple peelings   Call the clinic if this treatment plan is not working well for you  Follow up: 6 months   Thank you so much for letting me partake in your care today.  Don't hesitate to reach out if you have any additional concerns!  Hargis Springer, MD  [facility] [place] [place] No follow-ups on file.  No orders of the defined types were placed in this encounter.   Lab Orders  No laboratory test(s) ordered today    Diagnostics: None done     Medication List:  Current Outpatient Medications  Medication Sig Dispense Refill   albuterol  (VENTOLIN  HFA) 108 (90 Base) MCG/ACT inhaler Inhale 2 puffs into the lungs every 4 (four) hours as needed. 8 g 1   azelastine  (ASTELIN ) 0.1 % nasal spray Place 2 sprays into both nostrils 2 (two) times daily. Use in each nostril as directed 30 mL 12   busPIRone (BUSPAR) 10 MG tablet Take 10  mg by mouth 3 (three) times daily as needed.     clobetasol  ointment (TEMOVATE ) 0.05 % Apply 1 Application topically 2 (two) times daily. 30 g 0   desonide  (DESOWEN ) 0.05 % ointment Apply to red and itchy areas up to twice a day as needed.  Do not use this medication for longer than 2 weeks in a row 15 g 5   EPINEPHrine  0.3 mg/0.3 mL IJ SOAJ injection Inject 0.3 mg into the muscle as needed for anaphylaxis. 1 each 1   ferrous sulfate 324 MG TBEC Take 324 mg by mouth 2 (two) times a week.     fluticasone  (FLONASE ) 50 MCG/ACT nasal spray Place 2 sprays into both nostrils daily. 16 g 2   hydrOXYzine (ATARAX) 25 MG tablet Take by mouth.     levocetirizine ([name] ) 5 MG tablet Take 1 tablet (5 mg total) by mouth every evening. 90 tablet 1   Magnesium  400 MG CAPS Take 400 mg by mouth 2 (two) times a week.     mometasone-formoterol  (DULERA ) 200-5 MCG/ACT AERO Inhale 2 puffs into the lungs 2 (two) times daily. 13 g 5   NURTEC 75 MG TBDP Take 1 tablet by mouth daily as needed.  ondansetron  (ZOFRAN ) 4 MG tablet Take by mouth.     propranolol (INDERAL) 10 MG tablet Take by mouth.     SUMAtriptan (IMITREX) 50 MG tablet Take by mouth.     UNABLE TO FIND Apply topically. Tretinion cream 3x a week     valACYclovir (VALTREX) 500 MG tablet valacyclovir 500 mg tablet  TAKE TWO TABLETS (1,000 MG DOSE) BY MOUTH DAILY.     XOLAIR  150 MG/ML prefilled syringe INJECT 2 SYRINGES UNDER THE SKIN EVERY 4 WEEKS 2 mL 11   NIKKI 3-0.02 MG tablet Take 1 tablet by mouth daily. (Patient not taking: Reported on 10/02/2023)     OMEPRAZOLE PO Take by mouth daily. 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     I reviewed her past medical history, social history, family history, and environmental history and no significant changes have been reported from her previous visit.  ROS: All others negative except as noted per HPI.   Objective: BP 110/64 (BP Location: Left Arm, Patient Position: Sitting, Cuff Size: Normal)   Pulse 89   Temp 98.1 F (36.7 C) (Temporal)   Resp 18   Ht 5' 4 (1.626 m)   Wt 126 lb (57.2 kg)   SpO2 100%   BMI 21.63 kg/m  Body mass index is 21.63 kg/m. General Appearance:  Alert, cooperative, no distress, appears stated age  Head:  Normocephalic, without obvious abnormality, atraumatic  Eyes:  Conjunctiva clear, EOM's intact  Nose: Nares normal,   Throat: Lips, tongue normal; teeth and gums normal,   Neck: Supple, symmetrical  Lungs:   clear to auscultation bilaterally, Respirations unlabored, no coughing  Heart:  regular rate and rhythm and no murmur, Appears well perfused  Extremities: No edema  Skin: Skin color, texture, turgor normal, no rashes or lesions on visualized portions of skin   Neurologic: No gross deficits   Previous notes and tests were reviewed. The plan was reviewed with the patient/family, and all questions/concerned were addressed.  It was my pleasure to see Roberta Wood today and  participate in her care. Please feel free to contact me with any questions or concerns.  Sincerely,  Norris Lorin, MD  Allergy  & Immunology  [facility]   [place] Office: [phone]

## 2023-10-06 NOTE — Patient Instructions (Addendum)
 Positive celiac testing Urged patient to report results of GI Next step would likely be endoscopy and biopsies Instruct her not to avoid gluten at this time but she will need to be on gluten during her endoscopy Counseled patient to continue to engage with eating disorder therapy as she navigates this potentially new diagnosis  Asthma, controlled  Continue Dulera  200-2 puffs twice a day as needed with a spacer to prevent cough or wheeze.   Continue albuterol  2 puffs once every 4 hours as needed for cough or wheeze You may use albuterol  2 puffs 5 to 15 minutes before activity to decrease cough or wheeze  Allergic rhinitis Continue allergen avoidance measures directed toward mold as listed below Continue [name]  5 mg once a day as needed for runny nose or itch.   Continue azelastine  2 sprays in each nostril up to twice a day as needed for runny nose Consider saline nasal rinses as needed for nasal symptoms. Use this before any medicated nasal sprays for best result  Hives (urticaria), controlled  Continue zyxal to 5mg  1-4 times a day as needed to control hives   Continue xolair  300mg  every 4 weeks  Atopic dermatitis Continue a twice a day moisturizing routine Continue  desonide  0.05% ointment to red and itchy areas up to twice a day as needed.  Do not use this medication for longer than 2 weeks in a row  Contact dermatitis  Hydrocortisone  2.5% cream twice a day as needed for flares on face, armpit, groin  Clobetasol  twice a day as needed for flares for body  Continue to avoid nickel and products containing nickel  Oral allergy  syndrome Continue to avoid raw apples and apple peelings   Call the clinic if this treatment plan is not working well for you  Follow up: 6 months   Thank you so much for letting me partake in your care today.  Don't hesitate to reach out if you have any additional concerns!  Hargis Springer, MD  [facility] [place], [place]

## 2023-10-20 ENCOUNTER — Other Ambulatory Visit: Payer: Self-pay | Admitting: Obstetrics & Gynecology

## 2023-10-20 DIAGNOSIS — Z1231 Encounter for screening mammogram for malignant neoplasm of breast: Secondary | ICD-10-CM

## 2023-10-28 ENCOUNTER — Ambulatory Visit

## 2023-10-28 DIAGNOSIS — L501 Idiopathic urticaria: Secondary | ICD-10-CM

## 2023-10-30 ENCOUNTER — Ambulatory Visit: Admitting: Internal Medicine

## 2023-11-03 ENCOUNTER — Telehealth: Payer: Self-pay | Admitting: Internal Medicine

## 2023-11-03 NOTE — Telephone Encounter (Signed)
 Duwaine is scheduled with [facility] for 8/25 at 1:00 pm with Dr. Elaine.

## 2023-11-04 ENCOUNTER — Ambulatory Visit
Admission: RE | Admit: 2023-11-04 | Discharge: 2023-11-04 | Disposition: A | Discharge status: Home / Self Care | Source: Ambulatory Visit | Attending: Obstetrics & Gynecology | Admitting: Obstetrics & Gynecology

## 2023-11-04 DIAGNOSIS — Z1231 Encounter for screening mammogram for malignant neoplasm of breast: Secondary | ICD-10-CM

## 2023-11-25 ENCOUNTER — Ambulatory Visit

## 2023-11-25 DIAGNOSIS — L501 Idiopathic urticaria: Secondary | ICD-10-CM | POA: Diagnosis not present

## 2023-12-23 ENCOUNTER — Ambulatory Visit (INDEPENDENT_AMBULATORY_CARE_PROVIDER_SITE_OTHER)

## 2023-12-23 DIAGNOSIS — L501 Idiopathic urticaria: Secondary | ICD-10-CM | POA: Diagnosis not present

## 2024-01-20 ENCOUNTER — Ambulatory Visit

## 2024-01-20 DIAGNOSIS — L501 Idiopathic urticaria: Secondary | ICD-10-CM

## 2024-02-15 NOTE — Patient Instructions (Incomplete)
 Positive celiac testing- -Symptoms 70% better - celiac disease felt to be less likely due to normal duodenal biopsies and borderline TTG-IgA Continue to engage with eating disorder therapy  Asthma, not well controlled Continue prednisone from urgent care Start Dulera  100 mcg-2 puffs twice a day as needed with a spacer to prevent cough or wheeze.  Consider switching to flare pattern in future. Continue albuterol  2 puffs once every 4 hours as needed for cough or wheeze You may use albuterol  2 puffs 5 to 15 minutes before activity to decrease cough or wheeze Asthma control goals:  Full participation in all desired activities (may need albuterol  before activity) Albuterol  use two time or less a week on average (not counting use with activity) Cough interfering with sleep two time or less a month Oral steroids no more than once a year No hospitalizations   Allergic rhinitis Continue allergen avoidance measures directed toward mold as listed below Continue [name]  5 mg once a day as needed for runny nose or itch.   Continue azelastine  2 sprays in each nostril up to twice a day as needed for runny nose Consider saline nasal rinses as needed for nasal symptoms. Use this before any medicated nasal sprays for best result  Hives (urticaria), controlled  Continue zyxal to 5mg  1-4 times a day as needed to control hives   Continue xolair  300mg  every 4 weeks  Atopic dermatitis Continue a twice a day moisturizing routine Continue  desonide  0.05% ointment to red and itchy areas up to twice a day as needed.  Do not use this medication for longer than 2 weeks in a row  Contact dermatitis  Hydrocortisone  2.5% cream twice a day as needed for flares on face, armpit, groin  Clobetasol  twice a day as needed for flares for body  Continue to avoid nickel and products containing nickel  Oral allergy  syndrome Continue to avoid raw apples and apple peelings   Call the clinic if this treatment plan is not  working well for you  Keep already scheduled appointment on 03/26/23 @ 8:30 AM with Dr. Lorin or sooner if needed  Thank you so much for letting me partake in your care today.  Don't hesitate to reach out if you have any additional concerns!

## 2024-02-16 ENCOUNTER — Encounter: Payer: Self-pay | Admitting: Family

## 2024-02-16 ENCOUNTER — Other Ambulatory Visit: Payer: Self-pay | Admitting: Family

## 2024-02-16 ENCOUNTER — Ambulatory Visit: Admitting: Family

## 2024-02-16 ENCOUNTER — Other Ambulatory Visit: Payer: Self-pay

## 2024-02-16 VITALS — BP 110/88 | HR 88 | Temp 98.4°F

## 2024-02-16 DIAGNOSIS — L2389 Allergic contact dermatitis due to other agents: Secondary | ICD-10-CM

## 2024-02-16 DIAGNOSIS — J4540 Moderate persistent asthma, uncomplicated: Secondary | ICD-10-CM

## 2024-02-16 DIAGNOSIS — J3089 Other allergic rhinitis: Secondary | ICD-10-CM | POA: Diagnosis not present

## 2024-02-16 DIAGNOSIS — T7819XD Other adverse food reactions, not elsewhere classified, subsequent encounter: Secondary | ICD-10-CM

## 2024-02-16 DIAGNOSIS — L501 Idiopathic urticaria: Secondary | ICD-10-CM | POA: Diagnosis not present

## 2024-02-16 MED ORDER — BUDESONIDE-FORMOTEROL FUMARATE 160-4.5 MCG/ACT IN AERO: INHALATION_SPRAY | RESPIRATORY_TRACT | 5 refills | Status: AC

## 2024-02-16 MED ORDER — MOMETASONE FURO-FORMOTEROL FUM 100-5 MCG/ACT IN AERO: INHALATION_SPRAY | RESPIRATORY_TRACT | 5 refills | Status: DC

## 2024-02-16 MED ORDER — ALBUTEROL SULFATE HFA 108 (90 BASE) MCG/ACT IN AERS: INHALATION_SPRAY | RESPIRATORY_TRACT | 1 refills | Status: AC

## 2024-02-16 NOTE — Telephone Encounter (Signed)
 Please let Duwaine know that Dulera  is not covered and that we will be changing to budesonide -formoterol  (Breyna ) 160/4.5  mcg 2 puffs twice a day. I will send in the prescription.

## 2024-02-16 NOTE — Progress Notes (Signed)
 [address] [place] [address] Dept: [phone]  FOLLOW UP NOTE  Patient ID: Roberta Wood, female    DOB: 02-01-1981  Age: 43 y.o. MRN: [id number] Date of Office Visit: 02/16/2024  Assessment  Chief Complaint: office visit, Cough, and Breathing Problem (Asthma flare x 2 month ago)  HPI Roberta Wood is a 43 year old female who presents today for an acute visit of cough.  She was last seen on October 02, 2023 by [facility] for positive autoantibody screening for celiac disease, idiopathic urticaria, moderate persistent asthma without complication, pollen food allergy , allergic contact dermatitis, chronic idiopathic urticaria, and allergic rhinitis.  She denies any new diagnosis or surgery since her last office visit.  She reports that on October 12 she got her influenza vaccine and somewhere around the 15th or 16 October she had an asthma attack that she was able to get under control at home.  She had some shortness of breath and had a pulse ox at home.  She did not ever feel like she went back to normal so she went to urgent care where she was told that they did not hear any wheezing and that she was on the low end of normal.  Instead of giving her oral prednisone they decided to give her a steroid injection.  The first couple days she felt better, but then her heart rate went up and she got short of breath and hyperventilated.  She went back to urgent care and it was thought that they are a bit aggressive with her treatment and that she was anxious.  She then did okay until about a week and 1/2 to 2 weeks ago where she developed a cough and thought potentially she had bronchitis.  She went back to urgent care and they did not think that she had bronchitis.  She was given prednisone this past Friday.  She is on day 5 of prednisone and the cough is 75-ish percent better. She has two days of steroids left. The cough occurs here and there.  The cough is drier now.  In the beginning the  cough was productive and the sputum was white to light gray.  She denies any fevers, chills, or bodyaches.  She does mention that her kids have been swapping illnesses, but they were negative for flu and COVID-19.  She thinks that she heard wheezing once during the asthma attack, but is not sure.  She has also had tightness in her chest, shortness of breath, and nocturnal awakenings during the asthma attack day.  She did use her albuterol  once since being on the prednisone for chest tightness and it did help.  She is currently not taking Dulera  200 mcg daily.  She just uses it if needed.  She feels like this prescription is probably expired.  She feels like this is the first time that she has had issues with her asthma since being on Xolair .  When the cough first started she did start using Dulera  1 puff twice a day for 3 to 4 days, but it did not really seem to help.  She feels like her asthma in the past used to flare in the winter/cold weather.  Allergic rhinitis: She reports postnasal drip and denies rhinorrhea and nasal congestion.  She also reports that she has had some hoarseness.  She has not been treated for any sinus infections since we last saw her.  She has cut back on her [name]  to 1 to 2 tablets a  day.  She uses her azelastine  nasal spray when she remembers.  Urticaria: She reports that she has not had any hives since her last office visit.  She has cut her [name]  back to 1 to 2 tablets a day.  She continues to receive Xolair  300 mg every 4 weeks.  She does mention that she has had some eczema.  She has been working with her dermatologist and she wonders if some of the products she is using over the weather is causing it to flare.  She has desonide  0.05%, hydrocortisone  2.5%, and clobetasol  to use as needed.  Oral allergy  syndrome: She continues to avoid raw apples and apple feelings.  She also mentions that she avoids apple in all products.  She did buy a topical product that said it had apple  extract and it and she got a rash after using this so she is now avoiding all things that are apple.  Contact dermatitis: She continues to avoid nickel and products containing nickel.  Evaluation for celiac disease: She reports that the GI doctor thought it was less likely that she had celiac's disease.  She does feel like her symptoms are better.  She does continue to take fiber.   Drug Allergies:  Allergies  Allergen Reactions   Trazodone And Nefazodone Shortness Of Breath    Asthma attack with taking this due to mast cell release   Nsaids    Nickel Rash    Dermatologist tested   Penicillins Hives    Has patient had a PCN reaction causing immediate rash, facial/tongue/throat swelling, SOB or lightheadedness with hypotension: No Has patient had a PCN reaction causing severe rash involving mucus membranes or skin necrosis: Yes Has patient had a PCN reaction that required hospitalization No Has patient had a PCN reaction occurring within the last 10 years: No If all of the above answers are NO, then may proceed with Cephalosporin use.      Review of Systems: Negative except as per HPI  Physical Exam: BP 110/88   Pulse 88   Temp 98.4 F (36.9 C)   SpO2 98%    Physical Exam Constitutional:      Appearance: Normal appearance.  HENT:     Head: Normocephalic and atraumatic.     Comments: Pharynx normal, eyes normal, ears normal, nose normal    Right Ear: Tympanic membrane, ear canal and external ear normal.     Left Ear: Tympanic membrane, ear canal and external ear normal.     Nose: Nose normal.     Mouth/Throat:     Mouth: Mucous membranes are moist.     Pharynx: Oropharynx is clear.  Eyes:     Conjunctiva/sclera: Conjunctivae normal.  Cardiovascular:     Rate and Rhythm: Regular rhythm.     Heart sounds: Normal heart sounds.  Pulmonary:     Effort: Pulmonary effort is normal.     Breath sounds: Normal breath sounds.     Comments: Lungs clear to  auscultation Musculoskeletal:     Cervical back: Neck supple.  Skin:    General: Skin is warm.  Neurological:     Mental Status: She is alert and oriented to person, place, and time.  Psychiatric:        Mood and Affect: Mood normal.        Behavior: Behavior normal.        Thought Content: Thought content normal.        Judgment: Judgment normal.  Diagnostics: FVC 3.48 L (95%), FEV1 2.96 L (100%), FEV1/FVC 0.85.  Spirometry indicates normal spirometry.  Assessment and Plan: 1. Other allergic rhinitis   2. Not well controlled moderate persistent asthma   3. Idiopathic urticaria   4. Pollen-food allergy , subsequent encounter   5. Allergic contact dermatitis due to other agents   6. Chronic idiopathic urticaria     Meds ordered this encounter  Medications   mometasone-formoterol  (DULERA ) 100-5 MCG/ACT AERO    Sig: Inhale 2 puffs twice a day with spacer to help prevent cough and wheeze.  Rinse mouth out afterwards    Dispense:  1 each    Refill:  5   albuterol  (VENTOLIN  HFA) 108 (90 Base) MCG/ACT inhaler    Sig: Inhale 2 puffs every 4-6 hours as needed for cough, wheeze, tightness in chest, or shortness of breath    Dispense:  8 g    Refill:  1    Patient Instructions  Positive celiac testing- -Symptoms 70% better - celiac disease felt to be less likely due to normal duodenal biopsies and borderline TTG-IgA Continue to engage with eating disorder therapy  Asthma, not well controlled Continue prednisone from urgent care Start Dulera  100 mcg-2 puffs twice a day as needed with a spacer to prevent cough or wheeze.  Consider switching to flare pattern in future. Continue albuterol  2 puffs once every 4 hours as needed for cough or wheeze You may use albuterol  2 puffs 5 to 15 minutes before activity to decrease cough or wheeze Asthma control goals:  Full participation in all desired activities (may need albuterol  before activity) Albuterol  use two time or less a week on  average (not counting use with activity) Cough interfering with sleep two time or less a month Oral steroids no more than once a year No hospitalizations   Allergic rhinitis Continue allergen avoidance measures directed toward mold as listed below Continue [name]  5 mg once a day as needed for runny nose or itch.   Continue azelastine  2 sprays in each nostril up to twice a day as needed for runny nose Consider saline nasal rinses as needed for nasal symptoms. Use this before any medicated nasal sprays for best result  Hives (urticaria), controlled  Continue zyxal to 5mg  1-4 times a day as needed to control hives   Continue xolair  300mg  every 4 weeks  Atopic dermatitis Continue a twice a day moisturizing routine Continue  desonide  0.05% ointment to red and itchy areas up to twice a day as needed.  Do not use this medication for longer than 2 weeks in a row  Contact dermatitis  Hydrocortisone  2.5% cream twice a day as needed for flares on face, armpit, groin  Clobetasol  twice a day as needed for flares for body  Continue to avoid nickel and products containing nickel  Oral allergy  syndrome Continue to avoid raw apples and apple peelings   Call the clinic if this treatment plan is not working well for you  Keep already scheduled appointment on 03/26/23 @ 8:30 AM with Dr. Lorin or sooner if needed  Thank you so much for letting me partake in your care today.  Don't hesitate to reach out if you have any additional concerns!   Return in about 5 weeks (around 03/25/2024), or if symptoms worsen or fail to improve.    Thank you for the opportunity to care for this patient.  Please do not hesitate to contact me with questions.  Wanda Craze, FNP [facility] 

## 2024-02-16 NOTE — Addendum Note (Signed)
 Addended by: Sangeeta Youse on: 02/16/2024 01:48 PM   Modules accepted: Orders

## 2024-02-17 ENCOUNTER — Ambulatory Visit

## 2024-02-17 DIAGNOSIS — L501 Idiopathic urticaria: Secondary | ICD-10-CM

## 2024-02-17 NOTE — Telephone Encounter (Signed)
 LVM for patient about medication change with information to call office with any concerns or questions

## 2024-02-18 ENCOUNTER — Other Ambulatory Visit: Payer: Self-pay | Admitting: *Deleted

## 2024-02-18 ENCOUNTER — Telehealth: Payer: Self-pay | Admitting: Internal Medicine

## 2024-02-18 MED ORDER — EPINEPHRINE 0.3 MG/0.3ML IJ SOAJ: 0.3000 mg | INTRAMUSCULAR | 1 refills | Status: AC | PRN

## 2024-02-18 NOTE — Telephone Encounter (Signed)
 Refill has been sent in. Called the patient and asked for a return call to inform.

## 2024-02-18 NOTE — Telephone Encounter (Signed)
 Pt called and stated that she needs a new epinephrine  since hers is expired.

## 2024-02-22 ENCOUNTER — Other Ambulatory Visit: Payer: Self-pay | Admitting: Internal Medicine

## 2024-03-16 ENCOUNTER — Telehealth: Payer: Self-pay | Admitting: *Deleted

## 2024-03-16 ENCOUNTER — Ambulatory Visit

## 2024-03-16 DIAGNOSIS — L501 Idiopathic urticaria: Secondary | ICD-10-CM | POA: Diagnosis not present

## 2024-03-16 NOTE — Telephone Encounter (Signed)
 Roberta Wood was in the office today for her Xolair  and wanted to know if she needed to do anything regarding her insurance approving her Xolair  for next year. She states it is the same insurance carrying over.

## 2024-03-16 NOTE — Telephone Encounter (Signed)
 No she is good for next year

## 2024-03-25 ENCOUNTER — Ambulatory Visit: Admitting: Internal Medicine

## 2024-03-25 ENCOUNTER — Encounter: Payer: Self-pay | Admitting: Internal Medicine

## 2024-03-25 VITALS — BP 102/70 | HR 94 | Temp 99.0°F | Ht 65.0 in | Wt 126.8 lb

## 2024-03-25 DIAGNOSIS — T7819XD Other adverse food reactions, not elsewhere classified, subsequent encounter: Secondary | ICD-10-CM

## 2024-03-25 DIAGNOSIS — L501 Idiopathic urticaria: Secondary | ICD-10-CM

## 2024-03-25 DIAGNOSIS — J4540 Moderate persistent asthma, uncomplicated: Secondary | ICD-10-CM | POA: Diagnosis not present

## 2024-03-25 DIAGNOSIS — J3089 Other allergic rhinitis: Secondary | ICD-10-CM | POA: Diagnosis not present

## 2024-03-25 DIAGNOSIS — R7689 Other specified abnormal immunological findings in serum: Secondary | ICD-10-CM | POA: Diagnosis not present

## 2024-03-25 DIAGNOSIS — L2389 Allergic contact dermatitis due to other agents: Secondary | ICD-10-CM

## 2024-03-25 NOTE — Progress Notes (Signed)
 "  Follow Up Note  RE: TARIAH TRANSUE MRN: [id number] DOB: 20-Jun-1980 Date of Office Visit: 03/25/2024  Referring provider: Gladystine Erminio CROME, MD Primary care provider: Mavis Redge SAILOR, FNP  Chief Complaint: Asthma (Coughing still, but has been using it in the morning, in the evening if she uses it, it keeps her awake at night. ), Urticaria (Hives have started up again, did not have any for a year, but now happening again.), and Allergic Rhinitis  (Wakes up most mornings with a stuffy nose, but has been using her nasal spray daily. )  History of Present Illness: I had the pleasure of seeing Joyleen Haselton for a follow up visit at the [facility] on 03/25/2024. She is a 44 y.o. female, who is being followed for urticaria asthma, dermatitis, . Her previous allergy  office visit was on 12/225 with Wanda Craze FNP. Today is a regular follow up visit.  At last visit she was seen for an asthma exacerbation 3 days after her flu vaccine.  She seen urgent care as well as received a steroid injection and prednisone.  She was instructed to continue prednisone as prescribed and switch from Dulera  to Symbicort  due to insurance issues.  She had seen GI with negative duodenal biopsies making celiac's disease less likely.  History obtained from patient, chart review. Discussed the use of AI scribe software for clinical note transcription with the patient, who gave verbal consent to proceed.  History of Present Illness Ciaira Natividad is a 44 year old female with asthma and hives who presents for follow-up of her asthma and hives management.  Asthma symptoms and medication side effects - Asthma symptoms improving with symbicort   inhaler regimen (two puffs in the morning) - Wheezing controlled with inhaler use - Jitteriness persists as a side effect of inhaler if she does twice daily  - No requirement for more than one course of prednisone in the past year  Urticaria  (hives) - Hives have recurred, less severe than prior to Xolair  initiation - Onset of hives coincided with worsening asthma symptoms - Hives began to reappear after receiving a flu shot  Herpetic lesions (cold sores) - Two cold sores occurred in December, increased from baseline frequency of one every two years - Currently on valacyclovir for cold sore prevention - Recent illness and prednisone use may have contributed to increased cold sore frequency  Obsessive-compulsive disorder and environmental exposures - History of obsessive-compulsive disorder - Recently discovered and cleaned mold on a window behind humidifier which she thinks may have contributed to symptoms   Dietary management - Not currently avoiding gluten - Tolerating current diet without issues - endoscopy negative for celiacs   Rhinitis symptoms controlled with xyzal  and astelin .    Pertinent History/Diagnostics:  - Asthma: moderate persistent   - normal spirometry (08/23/21): ratio 89, 3.03L, 106%  - Allergic Rhinitis:   - SPT environmental panel (08/23/21): molds - Food Allergy  (apples)  - Hx of reaction: oral allergy   - SPT select foods ( 08/23/21): negative to apple  - Urticaria   - xolair  300mg  every 4 weeks started 12/2021, stopped (03/25/24) switched to rhapsido    - urticaria labs:  (10/2021) CU index, tryptase, alpha -gal, CBC were normal/negative -Dermatitis   -Patch testing: positive to nickel     Assessment and Plan: Eupha is a 44 y.o. female with: Moderate persistent asthma without complication  Chronic idiopathic urticaria  Positive autoantibody screening for celiac disease  Allergic contact  dermatitis due to other agents  Pollen-food allergy , subsequent encounter  Other allergic rhinitis Plan: Patient Instructions  Positive celiac testing- - celiac disease felt to be less likely due to normal duodenal biopsies and borderline TTG-IgA Continue to engage with eating disorder  therapy  Asthma, improved from exacerbation in December Continue Symbicort  160 mcg-1-2 puffs once a day as needed with a spacer to prevent cough or wheeze.  Consider switching to flare pattern in future. Continue albuterol  2 puffs once every 4 hours as needed for cough or wheeze You may use albuterol  2 puffs 5 to 15 minutes before activity to decrease cough or wheeze Asthma control goals:  Full participation in all desired activities (may need albuterol  before activity) Albuterol  use two time or less a week on average (not counting use with activity) Cough interfering with sleep two time or less a month Oral steroids no more than once a year No hospitalizations   Allergic rhinitis Continue allergen avoidance measures directed toward mold as listed below Continue [name]  5 mg once a day as needed for runny nose or itch.   Continue azelastine  2 sprays in each nostril up to twice a day as needed for runny nose Consider saline nasal rinses as needed for nasal symptoms. Use this before any medicated nasal sprays for best result  Hives (urticaria), uncontrolled Continue zyxal to 5mg  1-4 times a day as needed to control hives   Stop Xolair  Start Rhapsido   25 mg twice daily, samples given today  Atopic dermatitis Continue a twice a day moisturizing routine Continue  desonide  0.05% ointment to red and itchy areas up to twice a day as needed.  Do not use this medication for longer than 2 weeks in a row  Contact dermatitis  Hydrocortisone  2.5% cream twice a day as needed for flares on face, armpit, groin  Clobetasol  twice a day as needed for flares for body  Continue to avoid nickel and products containing nickel  Oral allergy  syndrome Continue to avoid raw apples and apple peelings   Call the clinic if this treatment plan is not working well for you  Follow up: 6 months  Thank you so much for letting me partake in your care today.  Don't hesitate to reach out if you have any additional  concerns! No follow-ups on file.  No orders of the defined types were placed in this encounter.   Lab Orders  No laboratory test(s) ordered today    Diagnostics: None done     Medication List:  Current Outpatient Medications  Medication Sig Dispense Refill   albuterol  (VENTOLIN  HFA) 108 (90 Base) MCG/ACT inhaler Inhale 2 puffs into the lungs every 4 (four) hours as needed. 8 g 1   albuterol  (VENTOLIN  HFA) 108 (90 Base) MCG/ACT inhaler Inhale 2 puffs every 4-6 hours as needed for cough, wheeze, tightness in chest, or shortness of breath 8 g 1   azelastine  (ASTELIN ) 0.1 % nasal spray Place 2 sprays into both nostrils 2 (two) times daily. Use in each nostril as directed 30 mL 12   budesonide -formoterol  (BREYNA ) 160-4.5 MCG/ACT inhaler Inhale 2 puffs twice a day with spacer. Rinse mouth out after 1 each 5   busPIRone (BUSPAR) 10 MG tablet Take 10 mg by mouth 3 (three) times daily as needed.     clobetasol  ointment (TEMOVATE ) 0.05 % Apply 1 Application topically 2 (two) times daily. 30 g 0   desonide  (DESOWEN ) 0.05 % ointment Apply to red and itchy areas up to twice a  day as needed.  Do not use this medication for longer than 2 weeks in a row 15 g 5   EPINEPHrine  0.3 mg/0.3 mL IJ SOAJ injection Inject 0.3 mg into the muscle as needed for anaphylaxis. 1 each 1   ferrous sulfate 324 MG TBEC Take 324 mg by mouth 2 (two) times a week.     fluticasone  (FLONASE ) 50 MCG/ACT nasal spray Place 2 sprays into both nostrils daily. 16 g 2   hydrOXYzine (ATARAX) 25 MG tablet Take by mouth.     levocetirizine ([name] ) 5 MG tablet Take 1 tablet (5 mg total) by mouth every evening. 90 tablet 1   Magnesium  400 MG CAPS Take 400 mg by mouth 2 (two) times a week.     NURTEC 75 MG TBDP Take 1 tablet by mouth daily as needed.     ondansetron  (ZOFRAN ) 4 MG tablet Take by mouth.     SUMAtriptan (IMITREX) 50 MG tablet Take by mouth.     UNABLE TO FIND Apply topically. Tretinion cream 3x a week     valACYclovir  (VALTREX) 500 MG tablet valacyclovir 500 mg tablet  TAKE TWO TABLETS (1,000 MG DOSE) BY MOUTH DAILY.     XOLAIR  150 MG/ML prefilled syringe INJECT 2 SYRINGES UNDER THE SKIN EVERY 4 WEEKS 2 mL 11   Current Facility-Administered Medications  Medication Dose Route Frequency Provider Last Rate Last Admin   omalizumab  (XOLAIR ) prefilled syringe 300 mg  300 mg Subcutaneous Q28 days Lorin Norris, MD   300 mg at 03/16/24 1039   Allergies: Allergies  Allergen Reactions   Trazodone And Nefazodone Shortness Of Breath    Asthma attack with taking this due to mast cell release   Nsaids    Nickel Rash    Dermatologist tested   Penicillins Hives    Has patient had a PCN reaction causing immediate rash, facial/tongue/throat swelling, SOB or lightheadedness with hypotension: No Has patient had a PCN reaction causing severe rash involving mucus membranes or skin necrosis: Yes Has patient had a PCN reaction that required hospitalization No Has patient had a PCN reaction occurring within the last 10 years: No If all of the above answers are NO, then may proceed with Cephalosporin use.     I reviewed her past medical history, social history, family history, and environmental history and no significant changes have been reported from her previous visit.  ROS: All others negative except as noted per HPI.   Objective: BP 102/70   Pulse 94   Temp 99 F (37.2 C)   Ht 5' 5 (1.651 m)   Wt 126 lb 12.8 oz (57.5 kg)   SpO2 99%   BMI 21.10 kg/m  Body mass index is 21.1 kg/m. General Appearance:  Alert, cooperative, no distress, appears stated age  Head:  Normocephalic, without obvious abnormality, atraumatic  Eyes:  Conjunctiva clear, EOM's intact  Nose: Nares normal,   Throat: Lips, tongue normal; teeth and gums normal,   Neck: Supple, symmetrical  Lungs:   clear to auscultation bilaterally, Respirations unlabored, no coughing  Heart:  regular rate and rhythm and no murmur, Appears well  perfused  Extremities: No edema  Skin: Skin color, texture, turgor normal, no rashes or lesions on visualized portions of skin   Neurologic: No gross deficits   Previous notes and tests were reviewed. The plan was reviewed with the patient/family, and all questions/concerned were addressed.  It was my pleasure to see Chelse today and participate in her care. Please  feel free to contact me with any questions or concerns.  Sincerely,  Hargis Springer, MD  Allergy  & Immunology  [facility]  [place] Office: [phone] "

## 2024-03-25 NOTE — Addendum Note (Signed)
 Addended by: DANIEL NIVIA DEL on: 03/25/2024 04:02 PM   Modules accepted: Orders

## 2024-03-25 NOTE — Patient Instructions (Addendum)
 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  Consider switching to flare pattern in future. Continue albuterol  2 puffs once every 4 hours as needed for cough or wheeze You may use albuterol  2 puffs 5 to 15 minutes before activity to decrease cough or wheeze Asthma control goals:  Full participation in all desired activities (may need albuterol  before activity) Albuterol  use two time or less a week on average (not counting use with activity) Cough interfering with sleep two time or less a month Oral steroids no more than once a year No hospitalizations   Allergic rhinitis Continue allergen avoidance measures directed toward mold as listed below Continue [name]  5 mg once a day as needed for runny nose or itch.   Continue azelastine  2 sprays in each nostril up to twice a day as needed for runny nose Consider saline nasal rinses as needed for nasal symptoms. Use this before any medicated nasal sprays for best result  Hives (urticaria), uncontrolled Continue zyxal to 5mg  1-4 times a day as needed to control hives   Stop Xolair  Start Rhapsido   25 mg twice daily, samples given today  Atopic dermatitis Continue a twice a day moisturizing routine Continue  desonide  0.05% ointment to red and itchy areas up to twice a day as needed.  Do not use this medication for longer than 2 weeks in a row  Contact dermatitis  Hydrocortisone  2.5% cream twice a day as needed for flares on face, armpit, groin  Clobetasol  twice a day as needed for flares for body  Continue to avoid nickel and products containing nickel  Oral allergy  syndrome Continue to avoid raw apples and apple peelings   Call the clinic if this treatment plan is not  working well for you  Follow up: 6 months  Thank you so much for letting me partake in your care today.  Don't hesitate to reach out if you have any additional concerns!

## 2024-03-29 ENCOUNTER — Telehealth: Payer: Self-pay | Admitting: Internal Medicine

## 2024-03-29 NOTE — Telephone Encounter (Signed)
 Do you to faxes for Rhapdiso, Tammy?

## 2024-03-29 NOTE — Telephone Encounter (Signed)
 PT SUPPORT STATES DOSAGE IS MISSING ( 2ND PAGE SECTION 6)   FOR RHAPSIDO , REQUEST TO REFAX  TO [phone]

## 2024-03-31 MED ORDER — RHAPSIDO 25 MG PO TABS: 25.0000 mg | ORAL_TABLET | Freq: Two times a day (BID) | ORAL | 11 refills | Status: AC

## 2024-03-31 NOTE — Telephone Encounter (Signed)
 Called patient and advised approval, copay card and submit for Rhapsido  to replace Xolair  to [facility]

## 2024-04-01 ENCOUNTER — Other Ambulatory Visit (HOSPITAL_COMMUNITY): Payer: Self-pay

## 2024-04-01 ENCOUNTER — Telehealth: Payer: Self-pay | Admitting: *Deleted

## 2024-04-01 NOTE — Telephone Encounter (Signed)
 [employer] patient support called and stated that they need the dosage, the quantity and the refills for Rhapsido  for [facility].  Please fax to [phone]

## 2024-04-06 ENCOUNTER — Telehealth: Payer: Self-pay | Admitting: Internal Medicine

## 2024-04-06 NOTE — Telephone Encounter (Signed)
 Roberta Wood called and stated that she wanted to be sure that we know she no longer wants to continue with her Xolair , and she wanted to make sure no more would be delivered for her, as she will not be able to pay for it.

## 2024-04-07 NOTE — Telephone Encounter (Signed)
 Called patient and advised d/c Xolair  since she is on Rhapsido . She is doing well on therapy with report of one hive that came and went quickly

## 2024-04-13 ENCOUNTER — Ambulatory Visit

## 2024-04-19 ENCOUNTER — Telehealth: Payer: Self-pay | Admitting: Internal Medicine

## 2024-04-19 NOTE — Telephone Encounter (Signed)
 Called about the medication Remibrutinib  (RHAPSIDO ) 25 MG TABS [[id number]]  and stated that dosage and refill was left blank on the second page also the patients email was left blank as well , they would like the paper work to be faxed to [phone] and if you have any other questions they be contacted at phone number [phone]

## 2024-04-20 NOTE — Telephone Encounter (Signed)
 Patient getting from pharmacy now doesn't need hub enrollment

## 2024-09-23 ENCOUNTER — Ambulatory Visit: Admitting: Internal Medicine
